# Patient Record
Sex: Female | Born: 1980 | Race: White | Hispanic: No | Smoking: Never smoker
Health system: Southern US, Community
[De-identification: ages and names within clinical notes are randomized; demographics above are authoritative.]

## PROBLEM LIST (undated history)

## (undated) DIAGNOSIS — F53 Postpartum depression: Secondary | ICD-10-CM

## (undated) DIAGNOSIS — O99345 Other mental disorders complicating the puerperium: Secondary | ICD-10-CM

## (undated) DIAGNOSIS — D696 Thrombocytopenia, unspecified: Secondary | ICD-10-CM

## (undated) DIAGNOSIS — M25361 Other instability, right knee: Secondary | ICD-10-CM

## (undated) DIAGNOSIS — Z87898 Personal history of other specified conditions: Secondary | ICD-10-CM

## (undated) HISTORY — DX: Postpartum depression: F53.0

## (undated) HISTORY — PX: TONSILLECTOMY: SUR1361

## (undated) HISTORY — DX: Other mental disorders complicating the puerperium: O99.345

## (undated) HISTORY — DX: Other instability, right knee: M25.361

## (undated) HISTORY — PX: WISDOM TOOTH EXTRACTION: SHX21

## (undated) HISTORY — DX: Personal history of other specified conditions: Z87.898

## (undated) HISTORY — DX: Thrombocytopenia, unspecified: D69.6

---

## 2012-08-09 ENCOUNTER — Other Ambulatory Visit: Payer: Self-pay

## 2012-08-18 ENCOUNTER — Other Ambulatory Visit (HOSPITAL_COMMUNITY): Payer: Self-pay | Admitting: Obstetrics and Gynecology

## 2012-08-18 DIAGNOSIS — Z3682 Encounter for antenatal screening for nuchal translucency: Secondary | ICD-10-CM

## 2012-08-27 LAB — OB RESULTS CONSOLE ABO/RH: RH Type: POSITIVE

## 2012-09-01 ENCOUNTER — Other Ambulatory Visit: Payer: Self-pay

## 2012-09-01 ENCOUNTER — Encounter (HOSPITAL_COMMUNITY): Payer: Self-pay

## 2012-09-01 ENCOUNTER — Ambulatory Visit (HOSPITAL_COMMUNITY): Payer: 59

## 2012-09-01 ENCOUNTER — Ambulatory Visit (HOSPITAL_COMMUNITY)
Admission: RE | Admit: 2012-09-01 | Discharge: 2012-09-01 | Disposition: A | Discharge status: Home / Self Care | Payer: 59 | Source: Ambulatory Visit | Attending: Obstetrics and Gynecology | Admitting: Obstetrics and Gynecology

## 2012-09-01 DIAGNOSIS — Z3689 Encounter for other specified antenatal screening: Secondary | ICD-10-CM | POA: Insufficient documentation

## 2012-09-01 DIAGNOSIS — O351XX0 Maternal care for (suspected) chromosomal abnormality in fetus, not applicable or unspecified: Secondary | ICD-10-CM | POA: Insufficient documentation

## 2012-09-01 DIAGNOSIS — Z3682 Encounter for antenatal screening for nuchal translucency: Secondary | ICD-10-CM

## 2012-09-01 DIAGNOSIS — O3510X0 Maternal care for (suspected) chromosomal abnormality in fetus, unspecified, not applicable or unspecified: Secondary | ICD-10-CM | POA: Insufficient documentation

## 2012-09-01 NOTE — Progress Notes (Signed)
[name]  was seen today for an ultrasound appointment.  See full report in [facility].  Impression: Single IUP at 13 0/7 weeks Normal NT (1.4 mm).  A nasal bone was visualized. First trimester aneuploidy screen performed as noted above.    Recommendations: Please do not draw triple/quad screen, though patient should be offered MSAFP for neural tube defect screening.  Recommend ultrasound for fetal anatomy approximately [redacted] weeks gestation.  Alpha Gula, MD

## 2013-03-05 ENCOUNTER — Inpatient Hospital Stay (HOSPITAL_COMMUNITY)
Admission: AD | Admit: 2013-03-05 | Discharge: 2013-03-07 | DRG: 775 | Disposition: A | Discharge status: Home / Self Care | Payer: 59 | Source: Ambulatory Visit | Attending: Obstetrics and Gynecology | Admitting: Obstetrics and Gynecology

## 2013-03-05 ENCOUNTER — Encounter (HOSPITAL_COMMUNITY): Payer: 59 | Admitting: Anesthesiology

## 2013-03-05 ENCOUNTER — Inpatient Hospital Stay (HOSPITAL_COMMUNITY): Payer: 59 | Admitting: Anesthesiology

## 2013-03-05 ENCOUNTER — Encounter (HOSPITAL_COMMUNITY): Payer: Self-pay | Admitting: *Deleted

## 2013-03-05 LAB — OB RESULTS CONSOLE HEPATITIS B SURFACE ANTIGEN: Hepatitis B Surface Ag: NEGATIVE

## 2013-03-05 LAB — CBC
HCT: 34.8 % — ABNORMAL LOW (ref 36.0–46.0)
HCT: 34.8 % — ABNORMAL LOW (ref 36.0–46.0)
HCT: 33.5 % — ABNORMAL LOW (ref 36.0–46.0)
MCH: 28 pg (ref 26.0–34.0)
MCH: 27.5 pg (ref 26.0–34.0)
MCH: 28.4 pg (ref 26.0–34.0)
MCHC: 33.9 g/dL (ref 30.0–36.0)
MCV: 82.5 fL (ref 78.0–100.0)
MCV: 83.3 fL (ref 78.0–100.0)
Platelets: 129 10*3/uL — ABNORMAL LOW (ref 150–400)
Platelets: 126 10*3/uL — ABNORMAL LOW (ref 150–400)
RBC: 4.18 MIL/uL (ref 3.87–5.11)
RDW: 13.3 % (ref 11.5–15.5)
RDW: 13.4 % (ref 11.5–15.5)
WBC: 13.4 10*3/uL — ABNORMAL HIGH (ref 4.0–10.5)

## 2013-03-05 LAB — OB RESULTS CONSOLE HIV ANTIBODY (ROUTINE TESTING): HIV: NONREACTIVE

## 2013-03-05 LAB — OB RESULTS CONSOLE GBS: GBS: NEGATIVE

## 2013-03-05 LAB — OB RESULTS CONSOLE RPR: RPR: NONREACTIVE

## 2013-03-05 LAB — OB RESULTS CONSOLE RUBELLA ANTIBODY, IGM: Rubella: IMMUNE

## 2013-03-05 MED ORDER — IBUPROFEN 600 MG PO TABS
600.0000 mg | ORAL_TABLET | Freq: Four times a day (QID) | ORAL | Status: DC | PRN
  Administered 2013-03-05: 600 mg via ORAL
  Filled 2013-03-05: qty 1

## 2013-03-05 MED ORDER — ONDANSETRON HCL 4 MG/2ML IJ SOLN: 4.0000 mg | Freq: Four times a day (QID) | INTRAMUSCULAR | Status: DC | PRN

## 2013-03-05 MED ORDER — EPHEDRINE 5 MG/ML INJ
10.0000 mg | INTRAVENOUS | Status: DC | PRN
  Filled 2013-03-05: qty 2

## 2013-03-05 MED ORDER — OXYCODONE-ACETAMINOPHEN 5-325 MG PO TABS
1.0000 | ORAL_TABLET | ORAL | Status: DC | PRN
Start: 2013-03-05 — End: 2013-03-06

## 2013-03-05 MED ORDER — LACTATED RINGERS IV SOLN
500.0000 mL | Freq: Once | INTRAVENOUS | Status: AC
  Administered 2013-03-05: 500 mL via INTRAVENOUS

## 2013-03-05 MED ORDER — CITRIC ACID-SODIUM CITRATE 334-500 MG/5ML PO SOLN: 30.0000 mL | ORAL | Status: DC | PRN

## 2013-03-05 MED ORDER — EPHEDRINE 5 MG/ML INJ
10.0000 mg | INTRAVENOUS | Status: DC | PRN
  Filled 2013-03-05: qty 4
  Filled 2013-03-05: qty 2

## 2013-03-05 MED ORDER — DIPHENHYDRAMINE HCL 50 MG/ML IJ SOLN: 12.5000 mg | INTRAMUSCULAR | Status: DC | PRN

## 2013-03-05 MED ORDER — LACTATED RINGERS IV SOLN
INTRAVENOUS | Status: DC
  Administered 2013-03-05 (×2): via INTRAVENOUS

## 2013-03-05 MED ORDER — PHENYLEPHRINE 40 MCG/ML (10ML) SYRINGE FOR IV PUSH (FOR BLOOD PRESSURE SUPPORT)
80.0000 ug | PREFILLED_SYRINGE | INTRAVENOUS | Status: DC | PRN
  Filled 2013-03-05: qty 2
  Filled 2013-03-05: qty 5

## 2013-03-05 MED ORDER — FLEET ENEMA 7-19 GM/118ML RE ENEM: 1.0000 | ENEMA | RECTAL | Status: DC | PRN

## 2013-03-05 MED ORDER — OXYTOCIN BOLUS FROM INFUSION: 500.0000 mL | INTRAVENOUS | Status: DC

## 2013-03-05 MED ORDER — LIDOCAINE HCL (PF) 1 % IJ SOLN
INTRAMUSCULAR | Status: DC | PRN
  Administered 2013-03-05 (×4): 4 mL

## 2013-03-05 MED ORDER — TERBUTALINE SULFATE 1 MG/ML IJ SOLN: 0.2500 mg | Freq: Once | INTRAMUSCULAR | Status: AC | PRN

## 2013-03-05 MED ORDER — PHENYLEPHRINE 40 MCG/ML (10ML) SYRINGE FOR IV PUSH (FOR BLOOD PRESSURE SUPPORT)
80.0000 ug | PREFILLED_SYRINGE | INTRAVENOUS | Status: DC | PRN
  Filled 2013-03-05: qty 2

## 2013-03-05 MED ORDER — OXYTOCIN 40 UNITS IN LACTATED RINGERS INFUSION - SIMPLE MED
1.0000 m[IU]/min | INTRAVENOUS | Status: DC
  Administered 2013-03-05: 1 m[IU]/min via INTRAVENOUS
  Filled 2013-03-05: qty 1000

## 2013-03-05 MED ORDER — ACETAMINOPHEN 325 MG PO TABS: 650.0000 mg | ORAL_TABLET | ORAL | Status: DC | PRN

## 2013-03-05 MED ORDER — OXYTOCIN 40 UNITS IN LACTATED RINGERS INFUSION - SIMPLE MED: 62.5000 mL/h | INTRAVENOUS | Status: DC

## 2013-03-05 MED ORDER — LIDOCAINE HCL (PF) 1 % IJ SOLN
30.0000 mL | INTRAMUSCULAR | Status: DC | PRN
  Filled 2013-03-05 (×2): qty 30

## 2013-03-05 MED ORDER — FENTANYL 2.5 MCG/ML BUPIVACAINE 1/10 % EPIDURAL INFUSION (WH - ANES)
14.0000 mL/h | INTRAMUSCULAR | Status: DC | PRN
  Administered 2013-03-05 (×2): 14 mL/h via EPIDURAL
  Filled 2013-03-05 (×2): qty 125

## 2013-03-05 MED ORDER — LACTATED RINGERS IV SOLN: 500.0000 mL | INTRAVENOUS | Status: DC | PRN

## 2013-03-05 NOTE — MAU Note (Signed)
Rupture of membranes at 0300, occasional uc.

## 2013-03-05 NOTE — Progress Notes (Signed)
Patient ID: [name], female   DOB: 1980/09/04, 32 y.o.   MRN: [id number] Delivery note:  The pt pushed very well and shortly before delivery the vertex rotated LOP to LOA and she delivered spontaneously over a second degree ML laceration a living female infant Apgars 9 and 9 at 1 and 5 minutes.The placenta delivered intact and the uterus was normal. Rectal was negative. There was 1 loop of nuchal cord. The laceration was repaired with 3-0 vicryl EBL 400 cc's.

## 2013-03-05 NOTE — H&P (Signed)
[name], [name] NO.:  [id number]  MEDICAL RECORD NO.:  [id number]  LOCATION:  9174                          FACILITY:  [facility]  PHYSICIAN:  Malachi Pro. Ambrose Mantle, M.D. DATE OF BIRTH:  [birth year]  DATE OF ADMISSION:  03/05/2013 DATE OF DISCHARGE:                             HISTORY & PHYSICAL   PRESENT ILLNESS:  This is a 32 year old white female para 0-0-1-0 gravida 2 with EDC on March 09, 2013, estimated gestational age [redacted] weeks and 3 days, who was admitted with premature rupture of the membranes at 3 a.m. on the day of admission.  Blood group and type is O positive with a negative antibody.  Rubella was positive.  Hepatitis B surface antigen negative, RPR nonreactive.  HIV negative.  GC and Chlamydia negative.  One-hour Glucola 128.  Group B strep is negative.  PAST MEDICAL HISTORY:  No significant history.  She had adenoidectomy and tonsillectomy in the past.  SOCIAL HISTORY:  She never smoked.  Formally used alcohol, none at the present time.  She is a Designer, jewellery.  ALLERGIES:  She has no known latex allergy.  She says she was allergic to AMOXICILLIN as a child, it caused her to have a rash.  FAMILY HISTORY:  Her maternal grandmother had colon cancer.  Maternal grandfather with heart attack.  PHYSICAL EXAMINATION:  VITAL SIGNS:  Temperature 98.5, pulse 90, respirations 18, blood pressure 114/76. CARDIOVASCULAR:  Heart normal size and sounds.  No murmurs. LUNGS:  Clear to auscultation. ABDOMEN:  Soft.  Fetal heart tones normal.  Cervix per the RN is 2 cm, 50%, vertex at -2.  Rupture of membranes was confirmed.  ADMITTING IMPRESSION:  Intrauterine pregnancy at 39 plus weeks, premature rupture of the membranes.  The patient has been started on Pitocin.     Malachi Pro. Ambrose Mantle, M.D.     TFH/MEDQ  D:  03/05/2013  T:  03/05/2013  Job:  [id number]

## 2013-03-05 NOTE — Progress Notes (Signed)
Patient ID: [name], female   DOB: 1980/10/07, 32 y.o.   MRN: [id number] The pt became fully dilated at 7:39 PM and was instructed to push. There was considerable bloody show and the vertex was at 0 station LOP There are some decelerations with contractions and the pt has been asked to push in a lateral tilt position.

## 2013-03-05 NOTE — Anesthesia Preprocedure Evaluation (Signed)

## 2013-03-05 NOTE — Anesthesia Procedure Notes (Signed)
Epidural Patient location during procedure: OB Start time: 03/05/2013 1:21 PM  Staffing Performed by: anesthesiologist   Preanesthetic Checklist Completed: patient identified, site marked, surgical consent, pre-op evaluation, timeout performed, IV checked, risks and benefits discussed and monitors and equipment checked  Epidural Patient position: sitting Prep: site prepped and draped and DuraPrep Patient monitoring: continuous pulse ox and blood pressure Approach: midline Injection technique: LOR air  Needle:  Needle type: Tuohy  Needle gauge: 17 G Needle length: 9 cm and 9 Needle insertion depth: 5 cm cm Catheter type: closed end flexible Catheter size: 19 Gauge Catheter at skin depth: 10 cm Test dose: negative  Assessment Events: blood not aspirated, injection not painful, no injection resistance, negative IV test and no paresthesia  Additional Notes Discussed risk of headache, infection, bleeding, nerve injury and failed or incomplete block.  Patient voices understanding and wishes to proceed.  Epidural placed easily on first attempt.  No paresthesia.  Patient tolerated procedure well with no apparent complications.  Jasmine December, MD Reason for block:procedure for pain

## 2013-03-05 NOTE — Progress Notes (Signed)
Patient ID: [name], female   DOB: [birth year], 32 y.o.   MRN: [id number] Pitocin at 7 mu/minute The contractions are not painful. The cervix is 3 cm 50% effaced and the vertex is at - 2 station. Will try to increase the pitocin.

## 2013-03-06 ENCOUNTER — Encounter (HOSPITAL_COMMUNITY): Payer: Self-pay | Admitting: *Deleted

## 2013-03-06 LAB — CBC
MCV: 82.6 fL (ref 78.0–100.0)
Platelets: 113 10*3/uL — ABNORMAL LOW (ref 150–400)
RBC: 3.79 MIL/uL — ABNORMAL LOW (ref 3.87–5.11)
WBC: 12.2 10*3/uL — ABNORMAL HIGH (ref 4.0–10.5)

## 2013-03-06 LAB — ABO/RH: ABO/RH(D): O POS

## 2013-03-06 MED ORDER — WITCH HAZEL-GLYCERIN EX PADS: 1.0000 "application " | MEDICATED_PAD | CUTANEOUS | Status: DC | PRN

## 2013-03-06 MED ORDER — SIMETHICONE 80 MG PO CHEW: 80.0000 mg | CHEWABLE_TABLET | ORAL | Status: DC | PRN

## 2013-03-06 MED ORDER — LANOLIN HYDROUS EX OINT: TOPICAL_OINTMENT | CUTANEOUS | Status: DC | PRN

## 2013-03-06 MED ORDER — IBUPROFEN 600 MG PO TABS
600.0000 mg | ORAL_TABLET | Freq: Four times a day (QID) | ORAL | Status: DC
  Administered 2013-03-06 – 2013-03-07 (×6): 600 mg via ORAL
  Filled 2013-03-06 (×6): qty 1

## 2013-03-06 MED ORDER — ONDANSETRON HCL 4 MG/2ML IJ SOLN: 4.0000 mg | INTRAMUSCULAR | Status: DC | PRN

## 2013-03-06 MED ORDER — DIBUCAINE 1 % RE OINT: 1.0000 "application " | TOPICAL_OINTMENT | RECTAL | Status: DC | PRN

## 2013-03-06 MED ORDER — OXYCODONE-ACETAMINOPHEN 5-325 MG PO TABS
1.0000 | ORAL_TABLET | ORAL | Status: DC | PRN
  Administered 2013-03-06: 1 via ORAL
  Filled 2013-03-06: qty 1

## 2013-03-06 MED ORDER — EPINEPHRINE TOPICAL FOR CIRCUMCISION 0.1 MG/ML: 1.0000 [drp] | TOPICAL | Status: DC | PRN

## 2013-03-06 MED ORDER — ACETAMINOPHEN FOR CIRCUMCISION 160 MG/5 ML
40.0000 mg | ORAL | Status: DC | PRN
  Filled 2013-03-06: qty 2.5

## 2013-03-06 MED ORDER — ONDANSETRON HCL 4 MG PO TABS: 4.0000 mg | ORAL_TABLET | ORAL | Status: DC | PRN

## 2013-03-06 MED ORDER — SUCROSE 24% NICU/PEDS ORAL SOLUTION
0.5000 mL | OROMUCOSAL | Status: DC | PRN
  Filled 2013-03-06: qty 0.5

## 2013-03-06 MED ORDER — DIPHENHYDRAMINE HCL 25 MG PO CAPS: 25.0000 mg | ORAL_CAPSULE | Freq: Four times a day (QID) | ORAL | Status: DC | PRN

## 2013-03-06 MED ORDER — ACETAMINOPHEN FOR CIRCUMCISION 160 MG/5 ML
40.0000 mg | Freq: Once | ORAL | Status: DC
  Filled 2013-03-06: qty 2.5

## 2013-03-06 MED ORDER — SENNOSIDES-DOCUSATE SODIUM 8.6-50 MG PO TABS
2.0000 | ORAL_TABLET | ORAL | Status: DC
  Administered 2013-03-06 – 2013-03-07 (×2): 2 via ORAL
  Filled 2013-03-06 (×2): qty 2

## 2013-03-06 MED ORDER — PRENATAL MULTIVITAMIN CH
1.0000 | ORAL_TABLET | Freq: Every day | ORAL | Status: DC
  Administered 2013-03-06 – 2013-03-07 (×2): 1 via ORAL
  Filled 2013-03-06 (×2): qty 1

## 2013-03-06 MED ORDER — LACTATED RINGERS IV SOLN: INTRAVENOUS | Status: AC

## 2013-03-06 MED ORDER — OXYTOCIN 40 UNITS IN LACTATED RINGERS INFUSION - SIMPLE MED: 62.5000 mL/h | INTRAVENOUS | Status: AC | PRN

## 2013-03-06 MED ORDER — TETANUS-DIPHTH-ACELL PERTUSSIS 5-2.5-18.5 LF-MCG/0.5 IM SUSP: 0.5000 mL | Freq: Once | INTRAMUSCULAR | Status: DC

## 2013-03-06 MED ORDER — ZOLPIDEM TARTRATE 5 MG PO TABS: 5.0000 mg | ORAL_TABLET | Freq: Every evening | ORAL | Status: DC | PRN

## 2013-03-06 MED ORDER — BENZOCAINE-MENTHOL 20-0.5 % EX AERO
1.0000 "application " | INHALATION_SPRAY | CUTANEOUS | Status: DC | PRN
  Administered 2013-03-06: 1 via TOPICAL
  Filled 2013-03-06: qty 56

## 2013-03-06 MED ORDER — MEASLES, MUMPS & RUBELLA VAC ~~LOC~~ INJ
0.5000 mL | INJECTION | Freq: Once | SUBCUTANEOUS | Status: DC
  Filled 2013-03-06: qty 0.5

## 2013-03-06 MED ORDER — LIDOCAINE 1%/NA BICARB 0.1 MEQ INJECTION
0.8000 mL | INJECTION | Freq: Once | INTRAVENOUS | Status: DC
  Filled 2013-03-06: qty 1

## 2013-03-06 NOTE — Progress Notes (Signed)
Patient ID: [name], female   DOB: Sep 10, 1980, 32 y.o.   MRN: [id number] #1 afebrile BP normal No problems HGB stable

## 2013-03-06 NOTE — Anesthesia Postprocedure Evaluation (Signed)
Anesthesia Post Note  Patient: [name]  Procedure(s) Performed: * No procedures listed *  Anesthesia type: Epidural  Patient location: Mother/Baby  Post pain: Pain level controlled  Post assessment: Post-op Vital signs reviewed  Last Vitals:  Filed Vitals:   03/06/13 0517  BP: 110/64  Pulse: 80  Temp: 36.7 C  Resp: 18    Post vital signs: Reviewed  Level of consciousness:alert  Complications: No apparent anesthesia complications

## 2013-03-07 MED ORDER — IBUPROFEN 600 MG PO TABS: 600.0000 mg | ORAL_TABLET | Freq: Four times a day (QID) | ORAL | Status: DC | PRN

## 2013-03-07 MED ORDER — OXYCODONE-ACETAMINOPHEN 5-325 MG PO TABS: 1.0000 | ORAL_TABLET | Freq: Four times a day (QID) | ORAL | Status: DC | PRN

## 2013-03-07 NOTE — Discharge Summary (Signed)
[name], [name] NO.:  [id number]  MEDICAL RECORD NO.:  [id number]  LOCATION:  9117                          [facility]ACILITY:  [facility]  PHYSICIAN:  Malachi Pro. Ambrose Mantle, M.D. DATE OF BIRTH:  11/11/1980  DATE OF ADMISSION:  03/05/2013 DATE OF DISCHARGE:  03/07/2013                              DISCHARGE SUMMARY   A 32 year old white female, para 0-0-1-0, gravida 2, [facility] March 09, 2013, with estimated gestational age of [redacted] weeks and 3 days who was admitted with premature rupture of membranes at 3 a.m.  All labs were normal.  On admission, the patient was thought to be 2 cm and 50% by the admitting nurse.  Pitocin was begun and the patient was 3 cm, 50% at 10:12 a.m.  The patient received an epidural and she progressed to full dilatation at 7:39 p.m. and was instructed to push.  There was considerable bloody show and the vertex was at 0 station, LOP.  She was noted to have some deceleration to the heart rate, so she was placed in lateral position.  She pushed very well, and shortly before delivery, the vertex rotated LOP to LOA.  She delivered spontaneously over a second-degree midline laceration by Dr. Ambrose Mantle, a living female infant, 8 pounds, 10 ounces with Apgars of 9 and 9 at 1 and 5 minutes.  Placenta delivered intact.  Uterus normal.  Rectal negative.  One loop of nuchal cord was present.  Laceration was repaired with 3-0 Vicryl.  Blood loss about 400 mL.  Postpartum, the patient did quite well and was discharged on the second postpartum day.  The baby was noted to have an undescended testicle and is going to have an ultrasound done for that.  LABORATORY DATA:  Showed initial hemoglobin of 11.8, hematocrit 34.8, white count 9700, platelet count 123,000.  Several followup blood counts were done and hemoglobin remained relatively stable, the lowest was 10.5, platelet counts ranged from 123-129-126 and 113.  RPR was nonreactive.  FINAL DIAGNOSES:  Intrauterine pregnancy  39 plus weeks, delivered left occiput anterior operation, spontaneous delivery left occiput anterior repair of second-degree midline laceration.  FINAL CONDITION:  Improved.  INSTRUCTIONS:  Include our regular discharge instruction booklet.  The patient is also to receive an after visit summary.  Prescriptions for Motrin 600 mg 30 tablets, 1 every 6 hours as needed for pain and Percocet 5/325, 15 tablets 1 every 6 hours as needed for pain.  The patient is advised to return to the office in 6 weeks for followup examination.     Malachi Pro. Ambrose Mantle, M.D.     TFH/MEDQ  D:  03/07/2013  T:  03/07/2013  Job:  [id number]

## 2013-03-07 NOTE — Progress Notes (Signed)
Patient ID: [name], female   DOB: 1981-02-11, 32 y.o.   MRN: [id number] #[id number] afebrile BP normal for d/c Baby to have [place] for undescended testicle

## 2013-12-05 LAB — OB RESULTS CONSOLE GC/CHLAMYDIA
Chlamydia: NEGATIVE
Gonorrhea: NEGATIVE

## 2013-12-05 LAB — OB RESULTS CONSOLE ABO/RH: RH Type: POSITIVE

## 2013-12-05 LAB — OB RESULTS CONSOLE RPR: RPR: NONREACTIVE

## 2013-12-05 LAB — OB RESULTS CONSOLE HIV ANTIBODY (ROUTINE TESTING): HIV: NONREACTIVE

## 2013-12-05 LAB — OB RESULTS CONSOLE HEPATITIS B SURFACE ANTIGEN: HEP B S AG: NEGATIVE

## 2013-12-05 LAB — OB RESULTS CONSOLE ANTIBODY SCREEN: Antibody Screen: NEGATIVE

## 2013-12-05 LAB — OB RESULTS CONSOLE RUBELLA ANTIBODY, IGM: RUBELLA: IMMUNE

## 2014-03-27 ENCOUNTER — Encounter (HOSPITAL_COMMUNITY): Payer: Self-pay | Admitting: *Deleted

## 2014-05-26 NOTE — L&D Delivery Note (Signed)
Delivery Note At 7:45 AM, after 1 contraction and 3 pushes,  a viable and healthy female was delivered via Vaginal, Spontaneous Delivery (Presentation: Left Occiput Anterior).  APGAR: 9, 9; weight  P.   Placenta status: Intact, Spontaneous.  Cord: 3 vessels with the following complications: Nuchal.    Anesthesia: Epidural  Episiotomy: None Lacerations: 2nd degree Suture Repair: 3.0 vicryl Est. Blood Loss (mL): 400cc  Mom to postpartum.  Baby to Couplet care / Skin to Skin.  Bovard-Stuckert, Elianna Windom 06/27/2014, 8:19 AM  Br/O+/IUD - Mirena PP/Tdap in PNC/RI  D/w pt circumcision for female infant, including r/b/a.  Will proceed.

## 2014-05-30 LAB — OB RESULTS CONSOLE GBS: GBS: NEGATIVE

## 2014-06-14 ENCOUNTER — Emergency Department (HOSPITAL_COMMUNITY): Admission: EM | Admit: 2014-06-14 | Discharge: 2014-06-14 | Discharge status: Absent without leave | Payer: Self-pay

## 2014-06-14 ENCOUNTER — Encounter (HOSPITAL_COMMUNITY): Payer: Self-pay | Admitting: Emergency Medicine

## 2014-06-14 ENCOUNTER — Emergency Department (HOSPITAL_COMMUNITY)
Admission: EM | Admit: 2014-06-14 | Discharge: 2014-06-14 | Disposition: A | Discharge status: Home / Self Care | Payer: 59 | Attending: Emergency Medicine | Admitting: Emergency Medicine

## 2014-06-14 DIAGNOSIS — R51 Headache: Secondary | ICD-10-CM | POA: Insufficient documentation

## 2014-06-14 DIAGNOSIS — O9989 Other specified diseases and conditions complicating pregnancy, childbirth and the puerperium: Secondary | ICD-10-CM | POA: Insufficient documentation

## 2014-06-14 DIAGNOSIS — R519 Headache, unspecified: Secondary | ICD-10-CM

## 2014-06-14 DIAGNOSIS — R140 Abdominal distension (gaseous): Secondary | ICD-10-CM | POA: Diagnosis not present

## 2014-06-14 DIAGNOSIS — R509 Fever, unspecified: Secondary | ICD-10-CM

## 2014-06-14 DIAGNOSIS — Z880 Allergy status to penicillin: Secondary | ICD-10-CM | POA: Insufficient documentation

## 2014-06-14 DIAGNOSIS — Z3490 Encounter for supervision of normal pregnancy, unspecified, unspecified trimester: Secondary | ICD-10-CM

## 2014-06-14 DIAGNOSIS — Z79899 Other long term (current) drug therapy: Secondary | ICD-10-CM | POA: Diagnosis not present

## 2014-06-14 DIAGNOSIS — Z3A39 39 weeks gestation of pregnancy: Secondary | ICD-10-CM | POA: Insufficient documentation

## 2014-06-14 DIAGNOSIS — R600 Localized edema: Secondary | ICD-10-CM | POA: Insufficient documentation

## 2014-06-14 LAB — CBC WITH DIFFERENTIAL/PLATELET
BASOS ABS: 0 10*3/uL (ref 0.0–0.1)
Basophils Relative: 0 % (ref 0–1)
Eosinophils Absolute: 0 10*3/uL (ref 0.0–0.7)
Eosinophils Relative: 0 % (ref 0–5)
HEMATOCRIT: 35.5 % — AB (ref 36.0–46.0)
Hemoglobin: 11.8 g/dL — ABNORMAL LOW (ref 12.0–15.0)
LYMPHS PCT: 14 % (ref 12–46)
Lymphs Abs: 1.1 10*3/uL (ref 0.7–4.0)
MCH: 28.9 pg (ref 26.0–34.0)
MCHC: 33.2 g/dL (ref 30.0–36.0)
MCV: 86.8 fL (ref 78.0–100.0)
MONO ABS: 0.4 10*3/uL (ref 0.1–1.0)
MONOS PCT: 5 % (ref 3–12)
NEUTROS ABS: 6.8 10*3/uL (ref 1.7–7.7)
Neutrophils Relative %: 81 % — ABNORMAL HIGH (ref 43–77)
Platelets: 122 10*3/uL — ABNORMAL LOW (ref 150–400)
RBC: 4.09 MIL/uL (ref 3.87–5.11)
RDW: 13.7 % (ref 11.5–15.5)
WBC: 8.4 10*3/uL (ref 4.0–10.5)

## 2014-06-14 LAB — URINALYSIS, ROUTINE W REFLEX MICROSCOPIC
Bilirubin Urine: NEGATIVE
Glucose, UA: NEGATIVE mg/dL
HGB URINE DIPSTICK: NEGATIVE
KETONES UR: 15 mg/dL — AB
Nitrite: NEGATIVE
Protein, ur: NEGATIVE mg/dL
SPECIFIC GRAVITY, URINE: 1.018 (ref 1.005–1.030)
Urobilinogen, UA: 1 mg/dL (ref 0.0–1.0)
pH: 8 (ref 5.0–8.0)

## 2014-06-14 LAB — URINE MICROSCOPIC-ADD ON

## 2014-06-14 LAB — COMPREHENSIVE METABOLIC PANEL
ALBUMIN: 3.4 g/dL — AB (ref 3.5–5.2)
ALT: 14 U/L (ref 0–35)
AST: 22 U/L (ref 0–37)
Alkaline Phosphatase: 142 U/L — ABNORMAL HIGH (ref 39–117)
Anion gap: 9 (ref 5–15)
BUN: 5 mg/dL — ABNORMAL LOW (ref 6–23)
CALCIUM: 8.5 mg/dL (ref 8.4–10.5)
CO2: 20 mmol/L (ref 19–32)
CREATININE: 0.6 mg/dL (ref 0.50–1.10)
Chloride: 103 mEq/L (ref 96–112)
GFR calc Af Amer: >90 mL/min (ref >90)
GFR calc non Af Amer: >90 mL/min (ref >90)
Glucose, Bld: 93 mg/dL (ref 70–99)
Potassium: 3.3 mmol/L — ABNORMAL LOW (ref 3.5–5.1)
Sodium: 132 mmol/L — ABNORMAL LOW (ref 135–145)
TOTAL PROTEIN: 6.9 g/dL (ref 6.0–8.3)
Total Bilirubin: 0.8 mg/dL (ref 0.3–1.2)

## 2014-06-14 MED ORDER — ACETAMINOPHEN 500 MG PO TABS
1000.0000 mg | ORAL_TABLET | Freq: Once | ORAL | Status: AC
  Administered 2014-06-14: 1000 mg via ORAL
  Filled 2014-06-14: qty 2

## 2014-06-14 NOTE — ED Notes (Signed)
MD at bedside. 

## 2014-06-14 NOTE — ED Notes (Signed)
Pt c/o constant headache since yesterday afternoon.  Pain score 6/10.  Denies light sensitivity and blurred vision.

## 2014-06-14 NOTE — ED Notes (Signed)
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  Sent over here by OBGYN. Pt is 38 wks 4 days pregnant and began having intermittent fever (being treated with tylenol).  Also c/o headache.  Able to touch chin to chest.  States that it is painful.  States that baby's HR was 180 at the doctor's office.  Denies NVD.

## 2014-06-14 NOTE — Progress Notes (Signed)
Pt is 34 year old G3P1 38w 5d gestation with headache over the last 2 days and low grade fever. No N/V/D, nobody else in the house sick. Was seen in OB office and sent to ER for further evaluation.  FH via ext monitor 170bpm, good variability, no decels. (OB aware of FH, FH was elevated in office). Occ contractions noted on monitor, pt states they are Braxton-Hicks and she has been having them over the past week or longer. Baby very active. No vag bleeding or discharge. No spotty vision or epigastric pain. Pt sipping clear liquids.

## 2014-06-14 NOTE — ED Provider Notes (Signed)
CSN: [id number]     Arrival date & time 06/14/14  1225 History   First MD Initiated Contact with Patient 06/14/14 1241     Chief Complaint  Patient presents with  . Fever  . Headache     (Consider location/radiation/quality/duration/timing/severity/associated sxs/prior Treatment) HPI Comments: Patient is a 34 year old female G3 P2002 at 38-1/[redacted] weeks gestation. She was sent prior her primary Dr. for evaluation of headache and fever and concerns over possible meningitis. She is at low grade fevers for the past 2 days, then developed a headache earlier this morning. She denies to me she is having any stiff neck or neck pain. She denies any cough, vomiting, or diarrhea.  Patient is a 34 y.o. female presenting with fever. The history is provided by the patient.  Fever Max temp prior to arrival:  102 Temp source:  Oral Severity:  Moderate Onset quality:  Gradual Duration:  2 days Timing:  Intermittent Progression:  Worsening Chronicity:  New Relieved by:  Nothing Worsened by:  Nothing tried Ineffective treatments:  None tried   Past Medical History  Diagnosis Date  . Medical history non-contributory    Past Surgical History  Procedure Laterality Date  . Tonsillectomy     History reviewed. No pertinent family history. History  Substance Use Topics  . Smoking status: Never Smoker   . Smokeless tobacco: Not on file  . Alcohol Use: No   OB History    Gravida Para Term Preterm AB TAB SAB Ectopic Multiple Living   3 1 1  0 1 0 1 0 0 1     Review of Systems  Constitutional: Positive for fever.  All other systems reviewed and are negative.     Allergies  Amoxicillin  Home Medications   Prior to Admission medications   Medication Sig Start Date End Date Taking? Authorizing Provider  acetaminophen (TYLENOL) 500 MG tablet Take 500 mg by mouth every 6 (six) hours as needed (pain.).   Yes Historical Provider, MD  oxyCODONE-acetaminophen (PERCOCET/ROXICET) 5-325 MG per  tablet Take 1 tablet by mouth every 6 (six) hours as needed. 03/07/13  Yes Melina Schools, MD  Prenatal Vit-Fe Fumarate-FA (PRENATAL MULTIVITAMIN) TABS tablet Take 1 tablet by mouth daily at 12 noon.   Yes Historical Provider, MD  ibuprofen (ADVIL,MOTRIN) 600 MG tablet Take 1 tablet (600 mg total) by mouth every 6 (six) hours as needed for pain. Patient not taking: Reported on 06/14/2014 03/07/13   Melina Schools, MD   BP 151/84 mmHg  Pulse 116  Temp(Src) 99.4 F (37.4 C) (Oral)  Resp 18  SpO2 98% Physical Exam  Constitutional: She is oriented to person, place, and time. She appears well-developed and well-nourished. No distress.  HENT:  Head: Normocephalic and atraumatic.  Eyes: EOM are normal. Pupils are equal, round, and reactive to light.  There is no papilledema on funduscopic exam.  Neck: Normal range of motion. Neck supple.  She has full range of motion of her neck without limitation.  Cardiovascular: Normal rate and regular rhythm.  Exam reveals no gallop and no friction rub.   No murmur heard. Pulmonary/Chest: Effort normal and breath sounds normal. No respiratory distress. She has no wheezes.  Abdominal: Soft. Bowel sounds are normal. She exhibits distension. There is no tenderness.  Fundal height is consistent with stated gestational age.  Musculoskeletal: Normal range of motion. She exhibits edema.  There is trace bilateral lower extremity edema.  Lymphadenopathy:    She has no cervical adenopathy.  Neurological:  She is alert and oriented to person, place, and time.  Skin: Skin is warm and dry. She is not diaphoretic.  Nursing note and vitals reviewed.   ED Course  Procedures (including critical care time) Labs Review Labs Reviewed  URINALYSIS, ROUTINE W REFLEX MICROSCOPIC  COMPREHENSIVE METABOLIC PANEL  CBC WITH DIFFERENTIAL    Imaging Review No results found.   EKG Interpretation None      MDM   Final diagnoses:  None    Patient presents with  fever and headache at 38-1/[redacted] weeks gestation. She was sent by her PCP over concerns for possible meningitis. The patient does not appear meningitic and she has no limitation with range of motion in her neck. She is nontoxic appearing. Workup reveals no elevation of white count and laboratory studies are not consistent with preeclampsia.  She was observed on the monitor by the rapid response OB nurse and was reassuring. 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 We had a lengthy conversation regarding the next appropriate step in her workup. She does not feel as though she has meningitis and is not thrilled about having an LP performed. We have decided to give this time. If she does not improve or worsens over the next 24-48 hours, she understands to return to discuss an LP.    Veryl Speak, MD 06/14/14 [phone]

## 2014-06-14 NOTE — ED Notes (Signed)
Rapid Response L & D at bedside.

## 2014-06-14 NOTE — Discharge Instructions (Signed)
Tylenol 1000 mg every 6 hours as needed for pain or fever.  Return to the ER if you develop worsening headache, stiff neck, or other new and concerning symptoms.   General Headache Without Cause A headache is pain or discomfort felt around the head or neck area. The specific cause of a headache may not be found. There are many causes and types of headaches. A few common ones are:  Tension headaches.  Migraine headaches.  Cluster headaches.  Chronic daily headaches. HOME CARE INSTRUCTIONS   Keep all follow-up appointments with your caregiver or any specialist referral.  Only take over-the-counter or prescription medicines for pain or discomfort as directed by your caregiver.  Lie down in a dark, quiet room when you have a headache.  Keep a headache journal to find out what may trigger your migraine headaches. For example, write down:  What you eat and drink.  How much sleep you get.  Any change to your diet or medicines.  Try massage or other relaxation techniques.  Put ice packs or heat on the head and neck. Use these 3 to 4 times per day for 15 to 20 minutes each time, or as needed.  Limit stress.  Sit up straight, and do not tense your muscles.  Quit smoking if you smoke.  Limit alcohol use.  Decrease the amount of caffeine you drink, or stop drinking caffeine.  Eat and sleep on a regular schedule.  Get 7 to 9 hours of sleep, or as recommended by your caregiver.  Keep lights dim if bright lights bother you and make your headaches worse. SEEK MEDICAL CARE IF:   You have problems with the medicines you were prescribed.  Your medicines are not working.  You have a change from the usual headache.  You have nausea or vomiting. SEEK IMMEDIATE MEDICAL CARE IF:   Your headache becomes severe.  You have a fever.  You have a stiff neck.  You have loss of vision.  You have muscular weakness or loss of muscle control.  You start losing your balance or have  trouble walking.  You feel faint or pass out.  You have severe symptoms that are different from your first symptoms. MAKE SURE YOU:   Understand these instructions.  Will watch your condition.  Will get help right away if you are not doing well or get worse. Document Released: 05/12/2005 Document Revised: 08/04/2011 Document Reviewed: 05/28/2011 [facility] Patient Information 2015 [place], [place]. This information is not intended to replace advice given to you by your health care provider. Make sure you discuss any questions you have with your health care provider.  Fever, Adult A fever is a higher than normal body temperature. In an adult, an oral temperature around 98.6 F (37 C) is considered normal. A temperature of 100.4 F (38 C) or higher is generally considered a fever. Mild or moderate fevers generally have no long-term effects and often do not require treatment. Extreme fever (greater than or equal to 106 F or 41.1 C) can cause seizures. The sweating that may occur with repeated or prolonged fever may cause dehydration. Elderly people can develop confusion during a fever. A measured temperature can vary with:  Age.  Time of day.  Method of measurement (mouth, underarm, rectal, or ear). The fever is confirmed by taking a temperature with a thermometer. Temperatures can be taken different ways. Some methods are accurate and some are not.  An oral temperature is used most commonly. Electronic thermometers are fast  and accurate.  An ear temperature will only be accurate if the thermometer is positioned as recommended by the manufacturer.  A rectal temperature is accurate and done for those adults who have a condition where an oral temperature cannot be taken.  An underarm (axillary) temperature is not accurate and not recommended. Fever is a symptom, not a disease.  CAUSES   Infections commonly cause fever.  Some noninfectious causes for fever include:  Some arthritis  conditions.  Some thyroid or adrenal gland conditions.  Some immune system conditions.  Some types of cancer.  A medicine reaction.  High doses of certain street drugs such as methamphetamine.  Dehydration.  Exposure to high outside or room temperatures.  Occasionally, the source of a fever cannot be determined. This is sometimes called a "fever of unknown origin" (FUO).  Some situations may lead to a temporary rise in body temperature that may go away on its own. Examples are:  Childbirth.  Surgery.  Intense exercise. HOME CARE INSTRUCTIONS   Take appropriate medicines for fever. Follow dosing instructions carefully. If you use acetaminophen to reduce the fever, be careful to avoid taking other medicines that also contain acetaminophen. Do not take aspirin for a fever if you are younger than age 6. There is an association with Reye's syndrome. Reye's syndrome is a rare but potentially deadly disease.  If an infection is present and antibiotics have been prescribed, take them as directed. Finish them even if you start to feel better.  Rest as needed.  Maintain an adequate fluid intake. To prevent dehydration during an illness with prolonged or recurrent fever, you may need to drink extra fluid.Drink enough fluids to keep your urine clear or pale yellow.  Sponging or bathing with room temperature water may help reduce body temperature. Do not use ice water or alcohol sponge baths.  Dress comfortably, but do not over-bundle. SEEK MEDICAL CARE IF:   You are unable to keep fluids down.  You develop vomiting or diarrhea.  You are not feeling at least partly better after 3 days.  You develop new symptoms or problems. SEEK IMMEDIATE MEDICAL CARE IF:   You have shortness of breath or trouble breathing.  You develop excessive weakness.  You are dizzy or you faint.  You are extremely thirsty or you are making little or no urine.  You develop new pain that was not  there before (such as in the head, neck, chest, back, or abdomen).  You have persistent vomiting and diarrhea for more than 1 to 2 days.  You develop a stiff neck or your eyes become sensitive to light.  You develop a skin rash.  You have a fever or persistent symptoms for more than 2 to 3 days.  You have a fever and your symptoms suddenly get worse. MAKE SURE YOU:   Understand these instructions.  Will watch your condition.  Will get help right away if you are not doing well or get worse. Document Released: 11/05/2000 Document Revised: 09/26/2013 Document Reviewed: 03/13/2011 [facility] Patient Information 2015 [place], [place]. This information is not intended to replace advice given to you by your health care provider. Make sure you discuss any questions you have with your health care provider.

## 2014-06-14 NOTE — Progress Notes (Signed)
Dr Ulanda Edison notified of pt's status and FH now 165bpm. Pt continues to contract about q 47mins but states they are not painful and describes them only as "tightness". Dr Ulanda Edison has cleared pt obstetrically. Reviewed signs of labor and ROM. Pt to keep next regularly scheduled OB appointment.

## 2014-06-27 ENCOUNTER — Inpatient Hospital Stay (HOSPITAL_COMMUNITY): Payer: 59 | Admitting: Anesthesiology

## 2014-06-27 ENCOUNTER — Inpatient Hospital Stay (HOSPITAL_COMMUNITY)
Admission: AD | Admit: 2014-06-27 | Discharge: 2014-06-28 | DRG: 775 | Disposition: A | Discharge status: Home / Self Care | Payer: 59 | Source: Ambulatory Visit | Attending: Obstetrics and Gynecology | Admitting: Obstetrics and Gynecology

## 2014-06-27 ENCOUNTER — Encounter (HOSPITAL_COMMUNITY): Payer: Self-pay | Admitting: *Deleted

## 2014-06-27 DIAGNOSIS — O480 Post-term pregnancy: Secondary | ICD-10-CM | POA: Diagnosis present

## 2014-06-27 DIAGNOSIS — Z3A40 40 weeks gestation of pregnancy: Secondary | ICD-10-CM | POA: Diagnosis present

## 2014-06-27 DIAGNOSIS — Z3483 Encounter for supervision of other normal pregnancy, third trimester: Secondary | ICD-10-CM | POA: Diagnosis present

## 2014-06-27 DIAGNOSIS — IMO0001 Reserved for inherently not codable concepts without codable children: Secondary | ICD-10-CM

## 2014-06-27 LAB — CBC
HCT: 37.3 % (ref 36.0–46.0)
HEMOGLOBIN: 12.6 g/dL (ref 12.0–15.0)
MCH: 28.5 pg (ref 26.0–34.0)
MCHC: 33.8 g/dL (ref 30.0–36.0)
MCV: 84.4 fL (ref 78.0–100.0)
PLATELETS: 147 10*3/uL — AB (ref 150–400)
RBC: 4.42 MIL/uL (ref 3.87–5.11)
RDW: 13.6 % (ref 11.5–15.5)
WBC: 11.2 10*3/uL — ABNORMAL HIGH (ref 4.0–10.5)

## 2014-06-27 MED ORDER — OXYCODONE-ACETAMINOPHEN 5-325 MG PO TABS: 2.0000 | ORAL_TABLET | ORAL | Status: DC | PRN

## 2014-06-27 MED ORDER — TERBUTALINE SULFATE 1 MG/ML IJ SOLN
0.2500 mg | Freq: Once | INTRAMUSCULAR | Status: DC | PRN
  Filled 2014-06-27: qty 1

## 2014-06-27 MED ORDER — LACTATED RINGERS IV SOLN: INTRAVENOUS | Status: DC

## 2014-06-27 MED ORDER — LACTATED RINGERS IV SOLN
500.0000 mL | Freq: Once | INTRAVENOUS | Status: AC
  Administered 2014-06-27: 500 mL via INTRAVENOUS

## 2014-06-27 MED ORDER — SENNOSIDES-DOCUSATE SODIUM 8.6-50 MG PO TABS
2.0000 | ORAL_TABLET | ORAL | Status: DC
  Administered 2014-06-27: 2 via ORAL
  Filled 2014-06-27: qty 2

## 2014-06-27 MED ORDER — ONDANSETRON HCL 4 MG PO TABS: 4.0000 mg | ORAL_TABLET | ORAL | Status: DC | PRN

## 2014-06-27 MED ORDER — DIPHENHYDRAMINE HCL 50 MG/ML IJ SOLN: 12.5000 mg | INTRAMUSCULAR | Status: DC | PRN

## 2014-06-27 MED ORDER — LANOLIN HYDROUS EX OINT: TOPICAL_OINTMENT | CUTANEOUS | Status: DC | PRN

## 2014-06-27 MED ORDER — EPHEDRINE 5 MG/ML INJ
10.0000 mg | INTRAVENOUS | Status: DC | PRN
  Filled 2014-06-27: qty 2

## 2014-06-27 MED ORDER — OXYTOCIN 40 UNITS IN LACTATED RINGERS INFUSION - SIMPLE MED
62.5000 mL/h | INTRAVENOUS | Status: DC
  Administered 2014-06-27: 999 mL/h via INTRAVENOUS
  Filled 2014-06-27: qty 1000

## 2014-06-27 MED ORDER — OXYCODONE-ACETAMINOPHEN 5-325 MG PO TABS: 1.0000 | ORAL_TABLET | ORAL | Status: DC | PRN

## 2014-06-27 MED ORDER — SIMETHICONE 80 MG PO CHEW: 80.0000 mg | CHEWABLE_TABLET | ORAL | Status: DC | PRN

## 2014-06-27 MED ORDER — OXYTOCIN 40 UNITS IN LACTATED RINGERS INFUSION - SIMPLE MED
1.0000 m[IU]/min | INTRAVENOUS | Status: DC
  Administered 2014-06-27: 1 m[IU]/min via INTRAVENOUS

## 2014-06-27 MED ORDER — PRENATAL MULTIVITAMIN CH
1.0000 | ORAL_TABLET | Freq: Every day | ORAL | Status: DC
  Administered 2014-06-27: 1 via ORAL
  Filled 2014-06-27: qty 1

## 2014-06-27 MED ORDER — PHENYLEPHRINE 40 MCG/ML (10ML) SYRINGE FOR IV PUSH (FOR BLOOD PRESSURE SUPPORT)
80.0000 ug | PREFILLED_SYRINGE | INTRAVENOUS | Status: DC | PRN
  Filled 2014-06-27: qty 2

## 2014-06-27 MED ORDER — LACTATED RINGERS IV SOLN: 500.0000 mL | INTRAVENOUS | Status: DC | PRN

## 2014-06-27 MED ORDER — CITRIC ACID-SODIUM CITRATE 334-500 MG/5ML PO SOLN: 30.0000 mL | ORAL | Status: DC | PRN

## 2014-06-27 MED ORDER — LACTATED RINGERS IV SOLN
INTRAVENOUS | Status: DC
  Administered 2014-06-27: 06:00:00 via INTRAVENOUS

## 2014-06-27 MED ORDER — FENTANYL 2.5 MCG/ML BUPIVACAINE 1/10 % EPIDURAL INFUSION (WH - ANES): 14.0000 mL/h | INTRAMUSCULAR | Status: DC | PRN

## 2014-06-27 MED ORDER — DIPHENHYDRAMINE HCL 25 MG PO CAPS: 25.0000 mg | ORAL_CAPSULE | Freq: Four times a day (QID) | ORAL | Status: DC | PRN

## 2014-06-27 MED ORDER — FLEET ENEMA 7-19 GM/118ML RE ENEM: 1.0000 | ENEMA | RECTAL | Status: DC | PRN

## 2014-06-27 MED ORDER — OXYTOCIN BOLUS FROM INFUSION: 500.0000 mL | INTRAVENOUS | Status: DC

## 2014-06-27 MED ORDER — PHENYLEPHRINE 40 MCG/ML (10ML) SYRINGE FOR IV PUSH (FOR BLOOD PRESSURE SUPPORT)
PREFILLED_SYRINGE | INTRAVENOUS | Status: AC
  Filled 2014-06-27: qty 20

## 2014-06-27 MED ORDER — DIBUCAINE 1 % RE OINT: 1.0000 "application " | TOPICAL_OINTMENT | RECTAL | Status: DC | PRN

## 2014-06-27 MED ORDER — OXYCODONE-ACETAMINOPHEN 5-325 MG PO TABS
1.0000 | ORAL_TABLET | ORAL | Status: DC | PRN
  Administered 2014-06-27: 1 via ORAL
  Filled 2014-06-27: qty 1

## 2014-06-27 MED ORDER — WITCH HAZEL-GLYCERIN EX PADS: 1.0000 "application " | MEDICATED_PAD | CUTANEOUS | Status: DC | PRN

## 2014-06-27 MED ORDER — ONDANSETRON HCL 4 MG/2ML IJ SOLN: 4.0000 mg | Freq: Four times a day (QID) | INTRAMUSCULAR | Status: DC | PRN

## 2014-06-27 MED ORDER — BENZOCAINE-MENTHOL 20-0.5 % EX AERO
1.0000 "application " | INHALATION_SPRAY | CUTANEOUS | Status: DC | PRN
  Administered 2014-06-27: 1 via TOPICAL
  Filled 2014-06-27: qty 56

## 2014-06-27 MED ORDER — ACETAMINOPHEN 325 MG PO TABS: 650.0000 mg | ORAL_TABLET | ORAL | Status: DC | PRN

## 2014-06-27 MED ORDER — ZOLPIDEM TARTRATE 5 MG PO TABS: 5.0000 mg | ORAL_TABLET | Freq: Every evening | ORAL | Status: DC | PRN

## 2014-06-27 MED ORDER — FENTANYL 2.5 MCG/ML BUPIVACAINE 1/10 % EPIDURAL INFUSION (WH - ANES)
INTRAMUSCULAR | Status: AC
  Administered 2014-06-27: 14 mL/h via EPIDURAL
  Filled 2014-06-27: qty 125

## 2014-06-27 MED ORDER — PRENATAL MULTIVITAMIN CH: 1.0000 | ORAL_TABLET | Freq: Every day | ORAL | Status: DC

## 2014-06-27 MED ORDER — LIDOCAINE HCL (PF) 1 % IJ SOLN
INTRAMUSCULAR | Status: DC | PRN
  Administered 2014-06-27 (×2): 5 mL

## 2014-06-27 MED ORDER — LIDOCAINE HCL (PF) 1 % IJ SOLN
30.0000 mL | INTRAMUSCULAR | Status: DC | PRN
  Administered 2014-06-27: 30 mL via SUBCUTANEOUS
  Filled 2014-06-27: qty 30

## 2014-06-27 MED ORDER — ONDANSETRON HCL 4 MG/2ML IJ SOLN: 4.0000 mg | INTRAMUSCULAR | Status: DC | PRN

## 2014-06-27 MED ORDER — IBUPROFEN 600 MG PO TABS
600.0000 mg | ORAL_TABLET | Freq: Four times a day (QID) | ORAL | Status: DC
Start: 2014-06-27 — End: 2014-06-28
  Administered 2014-06-27 – 2014-06-28 (×5): 600 mg via ORAL
  Filled 2014-06-27 (×5): qty 1

## 2014-06-27 NOTE — Lactation Note (Signed)
This note was copied from the chart of [place]. Lactation Consultation Note Mom called out for feeding assessment, LC was not available.  Mom later reports feeding went well and mom is hearing swallows.    Patient Name: [name] [id number] Date: 06/27/2014 Reason for consult: Initial assessment;Other (Comment) (per mom recently breast fed and presently sleeping )   Maternal Data Does the patient have breastfeeding experience prior to this delivery?: Yes  Feeding Feeding Type: Breast Fed Length of feed: 10 min  LATCH Score/Interventions Latch: Grasps breast easily, tongue down, lips flanged, rhythmical sucking.  Audible Swallowing: A few with stimulation Intervention(s): Skin to skin;Hand expression  Type of Nipple: Everted at rest and after stimulation  Comfort (Breast/Nipple): Soft / non-tender     Hold (Positioning): No assistance needed to correctly position infant at breast.  LATCH Score: 9  Lactation Tools Discussed/Used WIC Program: No   Consult Status Consult Status: Follow-up (mom to page for feeding assessent ) Date: 06/28/14 Follow-up type: In-patient    Kendell Bane Justine Null 06/27/2014, 9:36 PM

## 2014-06-27 NOTE — Anesthesia Postprocedure Evaluation (Signed)
  Anesthesia Post-op Note Anesthesia Post Note  Patient: [name]  Procedure(s) Performed: * No procedures listed *  Anesthesia type: Epidural  Patient location: Mother/Baby  Post pain: Pain level controlled  Post assessment: Post-op Vital signs reviewed  Last Vitals:  Filed Vitals:   06/27/14 1450  BP: 116/69  Pulse: 72  Temp: 36.9 C  Resp: 18    Post vital signs: Reviewed  Level of consciousness:alert  Complications: No apparent anesthesia complications

## 2014-06-27 NOTE — Progress Notes (Signed)
Patient ID: [name], female   DOB: 1981/02/21, 34 y.o.   MRN: [id number]  Late entry  Pt comfortable with epidural.  AFVSS gen NAD AROM for thin meconium, d/w pt SVE 9/90/0-+1  Pt progressed rapidly to C/C/+1-2  Will push and plan for delivery.

## 2014-06-27 NOTE — Anesthesia Preprocedure Evaluation (Signed)
Anesthesia Evaluation  Patient identified by MRN, date of birth, ID band Patient awake    Reviewed: Allergy & Precautions, H&P , Patient's Chart, lab work & pertinent test results  Airway Mallampati: II  TM Distance: >3 FB Neck ROM: full    Dental   Pulmonary  breath sounds clear to auscultation        Cardiovascular Rhythm:regular Rate:Normal     Neuro/Psych    GI/Hepatic   Endo/Other    Renal/GU      Musculoskeletal   Abdominal   Peds  Hematology   Anesthesia Other Findings   Reproductive/Obstetrics (+) Pregnancy                             Anesthesia Physical Anesthesia Plan  ASA: II  Anesthesia Plan: Epidural   Post-op Pain Management:    Induction:   Airway Management Planned:   Additional Equipment:   Intra-op Plan:   Post-operative Plan:   Informed Consent: I have reviewed the patients History and Physical, chart, labs and discussed the procedure including the risks, benefits and alternatives for the proposed anesthesia with the patient or authorized representative who has indicated his/her understanding and acceptance.     Plan Discussed with:   Anesthesia Plan Comments:         Anesthesia Quick Evaluation

## 2014-06-27 NOTE — Anesthesia Procedure Notes (Signed)
Epidural Patient location during procedure: OB Start time: 06/27/2014 6:04 AM  Staffing Anesthesiologist: Rudean Curt Performed by: anesthesiologist   Preanesthetic Checklist Completed: patient identified, site marked, surgical consent, pre-op evaluation, timeout performed, IV checked, risks and benefits discussed and monitors and equipment checked  Epidural Patient position: sitting Prep: site prepped and draped and DuraPrep Patient monitoring: continuous pulse ox and blood pressure Approach: midline Location: L3-L4 Injection technique: LOR air  Needle:  Needle type: Tuohy  Needle gauge: 17 G Needle length: 9 cm and 9 Needle insertion depth: 5 cm cm Catheter type: closed end flexible Catheter size: 19 Gauge Catheter at skin depth: 10 cm Test dose: negative  Assessment Events: blood not aspirated, injection not painful, no injection resistance, negative IV test and no paresthesia  Additional Notes Patient identified.  Risk benefits discussed including failed block, incomplete pain control, headache, nerve damage, paralysis, blood pressure changes, nausea, vomiting, reactions to medication both toxic or allergic, and postpartum back pain.  Patient expressed understanding and wished to proceed.  All questions were answered.  Sterile technique used throughout procedure and epidural site dressed with sterile barrier dressing. No paresthesia or other complications noted.The patient did not experience any signs of intravascular injection such as tinnitus or metallic taste in mouth nor signs of intrathecal spread such as rapid motor block. Please see nursing notes for vital signs.

## 2014-06-27 NOTE — Progress Notes (Signed)
Patient ID: [name], female   DOB: 12-05-1980, 34 y.o.   MRN: [id number] After her epidural her contractions have spaced out to 4-6 minutes apart. The cervix is 8 cm 100% effaced and the vertex is at -1/-2 station. Will start low dose pitocin

## 2014-06-27 NOTE — Lactation Note (Signed)
This note was copied from the chart of [place]. Lactation Consultation Note  Patient Name: Boy Jennet Scroggin [id number] Date: 06/27/2014 Reason for consult: Initial assessment;Other (Comment) (per mom recently breast fed and presently sleeping )  Mom is an  Experienced breast feeder. And per mom the baby has been to the breast several times since birth and  Has voided x2 and stooled x5. Per mom when latching has to ease down chin due to recessed chin.  Lc recommended firm support when baby is at the breast to ease the mouth open wider .  Per reports hearing swallows. Mother informed of post-discharge support and given phone number to the lactation department, including services for phone  call assistance; out-patient appointments; and breastfeeding support group. List of other breastfeeding resources in the community  given in the handout. Encouraged mother to call for problems or concerns related to breastfeeding. Mom encouraged to call with feeding cues.      Maternal Data Does the patient have breastfeeding experience prior to this delivery?: Yes  Feeding Feeding Type:  (encouraged to page for feeding assessment ) Length of feed: 10 min (per mom )  LATCH Score/Interventions                      Lactation Tools Discussed/Used WIC Program: No   Consult Status Consult Status: Follow-up (mom to page for feeding assessent ) Date: 06/28/14 Follow-up type: In-patient    Myer Haff 06/27/2014, 6:26 PM

## 2014-06-27 NOTE — H&P (Signed)
[name], [name] NO.:  [id number]  MEDICAL RECORD NO.:  [id number]  LOCATION:  9176                          FACILITY:  [facility]  PHYSICIAN:  Lucille Passy. [name], M.D. DATE OF BIRTH:  Mar 09, 1981  DATE OF ADMISSION:  06/27/2014 DATE OF DISCHARGE:                             HISTORY & PHYSICAL   PRESENT ILLNESS:  This is a 34 year old white female, para 1-0-1-1, gravida 3, EDC June 23, 2014, admitted in active labor.  Blood group and type O positive, negative antibody.  RPR negative.  Urine culture negative.  Hepatitis B surface antigen negative.  HIV negative.  GC and Chlamydia negative.  Rubella immune.  First trimester screen negative. AFP negative.  One-hour Glucola 107.  Repeat HIV and RPR negative and group B strep negative.  The patient began her prenatal course early in pregnancy.  Her EDC was assigned as June 23, 2014, based on her last menstrual period.  Ultrasound at 18-week showed normal growth and anatomy.  She had basically an uncomplicated prenatal course.  Her initial weight was 185 pounds.  Her last weight in the office on June 26, 2014 was 203.6.  At 38 weeks and 5 days, she had a temperature elevation to 102.  She developed a frontal headache.  She denied any respiratory symptoms, GI or GU symptoms.  She had pain with movement of her neck.  She was sent to the hospital and went to [facility] Emergency Room and was felt not to have any signs of meningitis. The next day, she continued to have a fever of 101-103.  She was recommended hydration and rest.  Her fever broke, and she was scheduled for induction on June 28, 2014 if not labor but then she went into labor this morning, beginning contractions to 15-20 minutes apart at 2 a.m. and at 4 a.m.  They got less than 5 minutes apart and quite painful.  She came to maternity admission unit and was noted to be 7 cm dilated.  She was admitted, has requested an epidural, and is waiting for  that.  PAST MEDICAL HISTORY:  Reveals that she did have postpartum depression. Placed on Zoloft and weaned off.  SURGICAL HISTORY:  T and A at age 81.  ALLERGIES:  Amoxicillin, rash as a child.  No latex allergy.  No food allergies.  SOCIAL HISTORY:  She never smoked.  Does not drink.  Denies illicit drugs.  Has 4 year of college, [employer]. 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  Maternal grandmother with cancer of the colon, died at age 31.  OBSTETRIC HISTORY:  Reveals 1 vaginal delivery in October 2014 of 7 pounds 10 ounces female vaginally.  PHYSICAL EXAMINATION:  VITAL SIGNS:  On admission:  Temperature is 98.0, pulse is 90, respirations 18, blood pressure is 118/80. HEART:  Normal size and sounds.  No murmurs. LUNGS:  Clear to auscultation.  Fundal height at her last prenatal visit was 39 cm per the RN and __________ the cervix is 7 cm.  Vertex presenting.  ADMITTING IMPRESSION:  Intrauterine pregnancy at 40 weeks and  4 days, active labor.  The patient requested an epidural.  She is trying to get that now.  She will be watched for continued progression in labor.     Lucille Passy. [name], M.D.     TFH/MEDQ  D:  06/27/2014  T:  06/27/2014  Job:  [id number]

## 2014-06-28 ENCOUNTER — Inpatient Hospital Stay (HOSPITAL_COMMUNITY): Admission: RE | Admit: 2014-06-28 | Payer: 59 | Source: Ambulatory Visit

## 2014-06-28 LAB — CBC
HCT: 32.5 % — ABNORMAL LOW (ref 36.0–46.0)
HEMOGLOBIN: 10.9 g/dL — AB (ref 12.0–15.0)
MCH: 29 pg (ref 26.0–34.0)
MCHC: 33.5 g/dL (ref 30.0–36.0)
MCV: 86.4 fL (ref 78.0–100.0)
Platelets: 122 10*3/uL — ABNORMAL LOW (ref 150–400)
RBC: 3.76 MIL/uL — ABNORMAL LOW (ref 3.87–5.11)
RDW: 13.9 % (ref 11.5–15.5)
WBC: 10.7 10*3/uL — ABNORMAL HIGH (ref 4.0–10.5)

## 2014-06-28 LAB — RPR: RPR Ser Ql: NONREACTIVE

## 2014-06-28 MED ORDER — OXYCODONE-ACETAMINOPHEN 5-325 MG PO TABS: 1.0000 | ORAL_TABLET | Freq: Four times a day (QID) | ORAL | Status: DC | PRN

## 2014-06-28 MED ORDER — IBUPROFEN 800 MG PO TABS: 800.0000 mg | ORAL_TABLET | Freq: Three times a day (TID) | ORAL | Status: AC | PRN

## 2014-06-28 MED ORDER — PRENATAL MULTIVITAMIN CH: 1.0000 | ORAL_TABLET | Freq: Every day | ORAL | Status: DC

## 2014-06-28 NOTE — Lactation Note (Addendum)
This note was copied from the chart of [place]. Lactation Consultation Note  Patient Name: [name] [id number] Date: 06/28/2014 Reason for consult: Follow-up assessment;Other (Comment) (baby was circ and presently sleeping )  Per mom nipples alittle tender. Sore nipple and engorgement prevention and tx reviewed .  LC recommended prior to latch - breast massage , hand express, and pre-pump to prime the milk ducts. Also discussed positioning pillows for a deeper latch due to the recessed chin. Per mom the Baby cluster fed last night. Per mom has a hand pump and DEBP at home.  Mother informed of post-discharge support and given phone number to the lactation department, including services  for phone call assistance; out-patient appointments; and breastfeeding support group. List of other breastfeeding resources  in the community given in the handout. Encouraged mother to call for problems or concerns related to breastfeeding. Baby was circ this am and has been sleepy since per mom and LC also noted baby sleeping in dads arms.   2nd visit - mom latched the baby with good pillow support. LC observed the feeding and noted intermittent dimpling . Reason for consult: Follow-up assessment;Other (Comment) (recessed chin )  2nd visit to check a latch - mom was independent with latch. LC noted intermittent dimpling  In cheeks. Showed mom how to obtain a deeper latch. Multiply swallows noted , increased with breast compressions. And dimpling disappeared. LC made recommendations for making sure the baby consistently obtained the depth at the breast. Sore nipple and engorgement prevention and tx reviewed. Mother informed of post-discharge support and given phone number to the lactation department, including services for phone call assistance; out-patient appointments; and breastfeeding support group. List of other breastfeeding resources in the community given in the handout. Encouraged  mother to call for problems or concerns related to breastfeeding.   Maternal Data    Feeding Feeding Type:  (pe rmom the last feeding was around 730 )  LATCH Score/Interventions                      Lactation Tools Discussed/Used Tools: Comfort gels (per mom has a hand pump at home )   Consult Status Consult Status: Complete Date: 06/28/14    Myer Haff 06/28/2014, 11:28 AM

## 2014-06-28 NOTE — Progress Notes (Signed)
Post Partum Day 1 Subjective: no complaints, up ad lib, voiding, tolerating PO and nl lochia, pain controlled  Objective: Blood pressure 107/66, pulse 72, temperature 98.6 F (37 C), temperature source Oral, resp. rate 20, height 5\' 7"  (1.702 m), weight 92.352 kg (203 lb 9.6 oz), SpO2 99 %, unknown if currently breastfeeding.  Physical Exam:  General: alert and no distress Lochia: appropriate Uterine Fundus: firm   Recent Labs  06/27/14 0535 06/28/14 0605  HGB 12.6 10.9*  HCT 37.3 32.5*    Assessment/Plan: Plan for discharge tomorrow, Breastfeeding and Lactation consult   LOS: 1 day   Bovard-Stuckert, Tanda Morrissey 06/28/2014, 8:13 AM

## 2014-06-28 NOTE — Discharge Summary (Signed)
Obstetric Discharge Summary Reason for Admission: onset of labor Prenatal Procedures: none Intrapartum Procedures: spontaneous vaginal delivery Postpartum Procedures: none Complications-Operative and Postpartum: 2nd  degree perineal laceration HEMOGLOBIN  Date Value Ref Range Status  06/28/2014 10.9* 12.0 - 15.0 g/dL Final   HCT  Date Value Ref Range Status  06/28/2014 32.5* 36.0 - 46.0 % Final    Physical Exam:  General: alert and no distress Lochia: appropriate Uterine Fundus: firm  Discharge Diagnoses: Term Pregnancy-delivered  Discharge Information: Date: 06/28/2014 Activity: pelvic rest Diet: routine Medications: PNV, Ibuprofen and Percocet Condition: stable Instructions: refer to practice specific booklet Discharge to: home Follow-up Information    Follow up with Bovard-Stuckert, Dannya Pitkin, MD. Schedule an appointment as soon as possible for a visit in 6 weeks.   Specialty:  Obstetrics and Gynecology   Why:  for postpartum check   Contact information:   [address] [place] [address] [phone]       Newborn Data: Live born female  Birth Weight: 8 lb 6 oz (3800 g) APGAR: 9, 9  Home with mother.  Bovard-Stuckert, Kiahna Banghart 06/28/2014, 9:06 AM

## 2014-06-28 NOTE — Progress Notes (Signed)
Clinical Social Work Department BRIEF PSYCHOSOCIAL ASSESSMENT 06/28/2014  Patient:  [name], [name]     Account Number:  [id number]     Admit date:  06/27/2014  Clinical Social Worker:  Lucita Ferrara, CLINICAL SOCIAL WORKER  Date/Time:  06/28/2014 10:30 AM  Referred by:  RN  Date Referred:  06/27/2014 Referred for  Other - See comment   Other Referral:   History of Postpartum depression   Interview type:  Family  PSYCHOSOCIAL DATA Living Status:  FAMILY Primary support name:  Matt Primary support relationship to patient:  SPOUSE Degree of support available:   MOB endorsed strong family support.   CURRENT CONCERNS Current Concerns  None Noted   SOCIAL WORK ASSESSMENT / PLAN CSW met with the MOB due to history of postpartum depression.  MOB provided consent for the FOB and other visitor to be present.  MOB and family presented as receptive and easily engaged.  MOB displayed a full range in affect, presented in a pleasant mood, no acute mental health symptoms noted.  MOB and FOB acknowledged reason for consult (history of PPD), and MOB denied any questions or concerns as she transitions to the postpartum period for a second time.  She stated that she was prescribed Zoloft to treat symptoms at approximately 5 months postpartum, and shared that she was tearful and having a difficult time with breastfeeding (low milk supply).  She shared that she also started to ask the FOB to stay at home from work in order to help her more.  The MOB and FOB reflected upon these behaviors and shared that this is what led to her reaching out to her MD to discuss treatment for postpartum depression.  MOB discussed that she took the Zoloft for approximately 4 months, felt benefit from the medication, but then discontinued the medication (with an appropriate ween) since she learned that she was pregnant again.  MOB denied any mood symptoms during the pregnancy, and denied anxiety related to developing postpartum  depression now.  She stated that she intends to not re-start medications unless needed, and she shared intention to take it "one day at a time".  MOB and FOB were receptive to exploring which symptoms would be needed to be seen that would warrant additional intervention, and they presented as aware of signs/symptoms of PPD. MOB shared that she has already spoken to her MD, and they are all aware of need to closely monitor her mood.    No further questions, concerns, or needs.     Assessment/plan status:  No Further Intervention Required/No barriers to discharge  Information/referral to community resources:   No referrals needed at ths time.   PATIENT'S/FAMILY'S RESPONSE TO PLAN OF CARE: MOB and FOB expressed appreication for the visit. They denied additional questions or concerns related to postpartum depression.  MOB expressed intention to notify her MD if she notes symptoms of postpartum depression.

## 2014-07-25 HISTORY — PX: INTRAUTERINE DEVICE INSERTION: SHX323

## 2014-08-08 LAB — HM PAP SMEAR: HM PAP: NEGATIVE

## 2014-09-16 ENCOUNTER — Emergency Department (HOSPITAL_BASED_OUTPATIENT_CLINIC_OR_DEPARTMENT_OTHER): Payer: 59

## 2014-09-16 ENCOUNTER — Emergency Department (HOSPITAL_BASED_OUTPATIENT_CLINIC_OR_DEPARTMENT_OTHER)
Admission: EM | Admit: 2014-09-16 | Discharge: 2014-09-16 | Disposition: A | Discharge status: Home / Self Care | Payer: 59 | Attending: Emergency Medicine | Admitting: Emergency Medicine

## 2014-09-16 ENCOUNTER — Encounter (HOSPITAL_BASED_OUTPATIENT_CLINIC_OR_DEPARTMENT_OTHER): Payer: Self-pay

## 2014-09-16 DIAGNOSIS — J209 Acute bronchitis, unspecified: Secondary | ICD-10-CM | POA: Diagnosis not present

## 2014-09-16 DIAGNOSIS — R059 Cough, unspecified: Secondary | ICD-10-CM

## 2014-09-16 DIAGNOSIS — J40 Bronchitis, not specified as acute or chronic: Secondary | ICD-10-CM

## 2014-09-16 DIAGNOSIS — Z880 Allergy status to penicillin: Secondary | ICD-10-CM | POA: Diagnosis not present

## 2014-09-16 DIAGNOSIS — R509 Fever, unspecified: Secondary | ICD-10-CM | POA: Diagnosis present

## 2014-09-16 DIAGNOSIS — Z3202 Encounter for pregnancy test, result negative: Secondary | ICD-10-CM | POA: Insufficient documentation

## 2014-09-16 DIAGNOSIS — Z79899 Other long term (current) drug therapy: Secondary | ICD-10-CM | POA: Diagnosis not present

## 2014-09-16 DIAGNOSIS — R05 Cough: Secondary | ICD-10-CM

## 2014-09-16 LAB — CBC WITH DIFFERENTIAL/PLATELET
BASOS PCT: 0 % (ref 0–1)
Basophils Absolute: 0 10*3/uL (ref 0.0–0.1)
EOS ABS: 0.1 10*3/uL (ref 0.0–0.7)
EOS PCT: 1 % (ref 0–5)
HEMATOCRIT: 41.2 % (ref 36.0–46.0)
Hemoglobin: 13.8 g/dL (ref 12.0–15.0)
Lymphocytes Relative: 13 % (ref 12–46)
Lymphs Abs: 0.9 10*3/uL (ref 0.7–4.0)
MCH: 28.5 pg (ref 26.0–34.0)
MCHC: 33.5 g/dL (ref 30.0–36.0)
MCV: 85.1 fL (ref 78.0–100.0)
MONOS PCT: 7 % (ref 3–12)
Monocytes Absolute: 0.5 10*3/uL (ref 0.1–1.0)
Neutro Abs: 5.1 10*3/uL (ref 1.7–7.7)
Neutrophils Relative %: 79 % — ABNORMAL HIGH (ref 43–77)
Platelets: 127 10*3/uL — ABNORMAL LOW (ref 150–400)
RBC: 4.84 MIL/uL (ref 3.87–5.11)
RDW: 13.3 % (ref 11.5–15.5)
WBC: 6.5 10*3/uL (ref 4.0–10.5)

## 2014-09-16 LAB — URINE MICROSCOPIC-ADD ON

## 2014-09-16 LAB — URINALYSIS, ROUTINE W REFLEX MICROSCOPIC
Bilirubin Urine: NEGATIVE
Glucose, UA: NEGATIVE mg/dL
Hgb urine dipstick: NEGATIVE
Ketones, ur: NEGATIVE mg/dL
Nitrite: NEGATIVE
Protein, ur: NEGATIVE mg/dL
Specific Gravity, Urine: 1.023 (ref 1.005–1.030)
UROBILINOGEN UA: 0.2 mg/dL (ref 0.0–1.0)
pH: 5.5 (ref 5.0–8.0)

## 2014-09-16 LAB — BASIC METABOLIC PANEL
Anion gap: 9 (ref 5–15)
BUN: 13 mg/dL (ref 6–23)
CO2: 23 mmol/L (ref 19–32)
CREATININE: 0.77 mg/dL (ref 0.50–1.10)
Calcium: 8.7 mg/dL (ref 8.4–10.5)
Chloride: 104 mmol/L (ref 96–112)
GFR calc Af Amer: >90 mL/min (ref >90)
GFR calc non Af Amer: >90 mL/min (ref >90)
GLUCOSE: 93 mg/dL (ref 70–99)
POTASSIUM: 3.8 mmol/L (ref 3.5–5.1)
Sodium: 136 mmol/L (ref 135–145)

## 2014-09-16 LAB — PREGNANCY, URINE: Preg Test, Ur: NEGATIVE

## 2014-09-16 LAB — RAPID STREP SCREEN (MED CTR MEBANE ONLY): Streptococcus, Group A Screen (Direct): NEGATIVE

## 2014-09-16 MED ORDER — DOXYCYCLINE HYCLATE 100 MG PO CAPS: 100.0000 mg | ORAL_CAPSULE | Freq: Two times a day (BID) | ORAL | Status: DC

## 2014-09-16 NOTE — Discharge Instructions (Signed)

## 2014-09-16 NOTE — ED Notes (Signed)
Pt verbalizes understanding of d/c instructions.  RX given reviewed.  Pt knows to f/U if releif of symptoms are not noted in a few days

## 2014-09-16 NOTE — ED Provider Notes (Signed)
CSN: [id number]     Arrival date & time 09/16/14  0807 History   First MD Initiated Contact with Patient 09/16/14 0818     Chief Complaint  Patient presents with  . Fever      HPI Patient presents with a 24-hour history of fever and sore throat and productive cough.  No other complaints.  Denies nausea vomiting or diarrhea.  Patient has an 11 week and 59-month-old at home.  Patient denies abdominal pain neck pain back pain or headache. Past Medical History  Diagnosis Date  . Medical history non-contributory   . SVD (spontaneous vaginal delivery) 06/27/2014   Past Surgical History  Procedure Laterality Date  . Tonsillectomy    . Wisdom tooth extraction     History reviewed. No pertinent family history. History  Substance Use Topics  . Smoking status: Never Smoker   . Smokeless tobacco: Not on file  . Alcohol Use: No   OB History    Gravida Para Term Preterm AB TAB SAB Ectopic Multiple Living   3 2 2  0 1 0 1 0 0 2     Review of Systems  All other systems reviewed and are negative  Allergies  Amoxicillin  Home Medications   Prior to Admission medications   Medication Sig Start Date End Date Taking? Authorizing Provider  doxycycline (VIBRAMYCIN) 100 MG capsule Take 1 capsule (100 mg total) by mouth 2 (two) times daily. 09/16/14   Leonard Schwartz, MD  ibuprofen (ADVIL,MOTRIN) 800 MG tablet Take 1 tablet (800 mg total) by mouth every 8 (eight) hours as needed. 06/28/14   Janyth Contes, MD  oxyCODONE-acetaminophen (PERCOCET/ROXICET) 5-325 MG per tablet Take 1-2 tablets by mouth every 6 (six) hours as needed. 06/28/14   Janyth Contes, MD  Prenatal Vit-Fe Fumarate-FA (PRENATAL MULTIVITAMIN) TABS tablet Take 1 tablet by mouth daily at 12 noon. 06/28/14   Jody Bovard-Stuckert, MD   BP 123/81 mmHg  Pulse 120  Temp(Src) 102.9 F (39.4 C) (Oral)  Resp 20  Ht 5\' 6"  (1.676 m)  Wt 177 lb (80.287 kg)  BMI 28.58 kg/m2  SpO2 96%  LMP   Breastfeeding? Unknown Physical  Exam Physical Exam  Nursing note and vitals reviewed. Constitutional: She is oriented to person, place, and time. She appears well-developed and well-nourished. No distress.  HENT: No evidence of erythematous tonsils or exudate of pharyngitis. Head: Normocephalic and atraumatic.  Eyes: Pupils are equal, round, and reactive to light.  Neck: Normal range of motion.  No significant adenopathy  Cardiovascular: Normal rate and intact distal pulses.   Pulmonary/Chest: No respiratory distress.  no wheezes rales or rhonchi Abdominal: Normal appearance. She exhibits no distension.  No CVA tenderness percussion  Musculoskeletal: Normal range of motion.  Neurological: She is alert and oriented to person, place, and time. No cranial nerve deficit.  Skin: Skin is warm and dry. No rash noted.    ED Course  Procedures (including critical care time)  Labs Review Labs Reviewed  CBC WITH DIFFERENTIAL/PLATELET - Abnormal; Notable for the following:    Platelets 127 (*)    Neutrophils Relative % 79 (*)    All other components within normal limits  URINALYSIS, ROUTINE W REFLEX MICROSCOPIC - Abnormal; Notable for the following:    Leukocytes, UA SMALL (*)    All other components within normal limits  URINE MICROSCOPIC-ADD ON - Abnormal; Notable for the following:    Bacteria, UA MANY (*)    All other components within normal limits  RAPID STREP  SCREEN  CULTURE, GROUP A STREP  BASIC METABOLIC PANEL  PREGNANCY, URINE    Imaging Review Dg Chest 2 View  09/16/2014   CLINICAL DATA:  Fever and cough for 2 day  EXAM: CHEST  2 VIEW  COMPARISON:  None.  FINDINGS: The heart size and mediastinal contours are within normal limits. Both lungs are clear. The visualized skeletal structures are unremarkable.  IMPRESSION: No active cardiopulmonary disease.   Electronically Signed   By: Marybelle Killings M.D.   On: 09/16/2014 09:25     EKG Interpretation None      MDM   Final diagnoses:  Cough  Bronchitis         Leonard Schwartz, MD 09/16/14 [phone]

## 2014-09-16 NOTE — ED Notes (Signed)
Pt reports generalized aches and  cough yesterday am, No fever.  This am woke feeling worse, took temp at home 101.7.  Took  Tylenol 1000 mg  at 0800.  Sore throat c coughing.

## 2014-09-18 LAB — CULTURE, GROUP A STREP: Strep A Culture: NEGATIVE

## 2015-09-03 ENCOUNTER — Encounter: Payer: Self-pay | Admitting: Family Medicine

## 2015-09-03 ENCOUNTER — Ambulatory Visit (INDEPENDENT_AMBULATORY_CARE_PROVIDER_SITE_OTHER): Payer: 59 | Admitting: Family Medicine

## 2015-09-03 VITALS — BP 139/80 | HR 70 | Temp 98.5°F | Resp 16 | Ht 66.0 in | Wt 184.0 lb

## 2015-09-03 DIAGNOSIS — Z0000 Encounter for general adult medical examination without abnormal findings: Secondary | ICD-10-CM | POA: Diagnosis not present

## 2015-09-03 NOTE — Progress Notes (Signed)
Office Note 09/03/2015  CC:  Chief Complaint  Patient presents with  . Establish Care  . Annual Exam    Pt is not fasting.     HPI:  [name] is a 35 y.o. White female who is here to establish care and get annual health maintenance exam. Patient's most recent primary MD: her GYN Old records from her GYN, Dr. Willis Modena, were reviewed prior to or during today's visit.  Past Medical History  Diagnosis Date  . Postpartum depression   . Thrombocytopenia (HCC)     Mild; above 100K  . History of syncope     As a teen.  Got tilt table testing and ended up on beta blocker for autonomic dysfunction.  Not an issue in since age 62 yrs.    Past Surgical History  Procedure Laterality Date  . Tonsillectomy    . Wisdom tooth extraction      Family History  Problem Relation Age of Onset  . Hypertension Mother   . Cancer Maternal Aunt   . Diabetes Maternal Uncle   . Heart disease Maternal Uncle   . Colon cancer Maternal Grandmother   . Heart disease Maternal Grandfather   . Heart attack Maternal Grandfather   . Alcohol abuse Paternal Grandmother     Social History   Social History  . Marital Status: Married    Spouse Name: N/A  . Number of Children: N/A  . Years of Education: N/A   Occupational History  . Not on file.   Social History Main Topics  . Smoking status: Never Smoker   . Smokeless tobacco: Never Used  . Alcohol Use: Yes     Comment: Occasionally/Social  . Drug Use: No  . Sexual Activity: Not on file   Other Topics Concern  . Not on file   Social History Narrative   Married, 2 children (2014 and 2016).   Occupation: Engineer, drilling.   Alc: social.   No tob.   Exercise: likes to but "out of practice".   Diet: "could do better"    Outpatient Encounter Prescriptions as of 09/03/2015  Medication Sig  . ibuprofen (ADVIL,MOTRIN) 800 MG tablet Take 1 tablet (800 mg total) by mouth every 8 (eight) hours as needed.  . [DISCONTINUED]  doxycycline (VIBRAMYCIN) 100 MG capsule Take 1 capsule (100 mg total) by mouth 2 (two) times daily. (Patient not taking: Reported on 09/03/2015)  . [DISCONTINUED] oxyCODONE-acetaminophen (PERCOCET/ROXICET) 5-325 MG per tablet Take 1-2 tablets by mouth every 6 (six) hours as needed. (Patient not taking: Reported on 09/03/2015)  . [DISCONTINUED] Prenatal Vit-Fe Fumarate-FA (PRENATAL MULTIVITAMIN) TABS tablet Take 1 tablet by mouth daily at 12 noon. (Patient not taking: Reported on 09/03/2015)   No facility-administered encounter medications on file as of 09/03/2015.    Allergies  Allergen Reactions  . Amoxicillin Rash    ROS Review of Systems  Constitutional: Negative for fever, chills, appetite change and fatigue.  HENT: Negative for congestion, dental problem, ear pain and sore throat.   Eyes: Negative for discharge, redness and visual disturbance.  Respiratory: Negative for cough, chest tightness, shortness of breath and wheezing.   Cardiovascular: Negative for chest pain, palpitations and leg swelling.  Gastrointestinal: Negative for nausea, vomiting, abdominal pain, diarrhea and blood in stool.  Genitourinary: Negative for dysuria, urgency, frequency, hematuria, flank pain and difficulty urinating.  Musculoskeletal: Negative for myalgias, back pain, joint swelling, arthralgias and neck stiffness.  Skin: Negative for pallor and rash.  Neurological: Negative for dizziness, speech  difficulty, weakness and headaches.  Hematological: Negative for adenopathy. Does not bruise/bleed easily.  Psychiatric/Behavioral: Negative for confusion and sleep disturbance. The patient is not nervous/anxious.     PE; Blood pressure 139/80, pulse 70, temperature 98.5 F (36.9 C), temperature source Oral, resp. rate 16, height 5\' 6"  (1.676 m), weight 184 lb (83.462 kg), SpO2 96 %, unknown if currently breastfeeding. Body mass index is 29.71 kg/(m^2). Pt examined with Sharen Hones, CMA, as chaperone. Gen:  Alert, well appearing.  Patient is oriented to person, place, time, and situation. AFFECT: pleasant, lucid thought and speech. ENT: Ears: EACs clear, normal epithelium.  TMs with good light reflex and landmarks bilaterally.  Eyes: no injection, icteris, swelling, or exudate.  EOMI, PERRLA. Nose: no drainage or turbinate edema/swelling.  No injection or focal lesion.  Mouth: lips without lesion/swelling.  Oral mucosa pink and moist.  Dentition intact and without obvious caries or gingival swelling.  Oropharynx without erythema, exudate, or swelling.  Neck: supple/nontender.  No LAD, mass, or TM.  Carotid pulses 2+ bilaterally, without bruits. CV: RRR, no m/r/g.   LUNGS: CTA bilat, nonlabored resps, good aeration in all lung fields. ABD: soft, NT, ND, BS normal.  No hepatospenomegaly or mass.  No bruits. EXT: no clubbing, cyanosis, or edema.  Musculoskeletal: no joint swelling, erythema, warmth, or tenderness.  ROM of all joints intact. Skin - no sores or suspicious lesions or rashes or color changes   Pertinent labs:   Glucose 93 on 09/16/14  Lab Results  Component Value Date   WBC 6.5 09/16/2014   HGB 13.8 09/16/2014   HCT 41.2 09/16/2014   MCV 85.1 09/16/2014   PLT 127* 09/16/2014   Lab Results  Component Value Date   CREATININE 0.77 09/16/2014   BUN 13 09/16/2014   NA 136 09/16/2014   K 3.8 09/16/2014   CL 104 09/16/2014   CO2 23 09/16/2014   Lab Results  Component Value Date   ALT 14 06/14/2014   AST 22 06/14/2014   ALKPHOS 142* 06/14/2014   BILITOT 0.8 06/14/2014   ASSESSMENT AND PLAN:   New pt: old records reviewed.  Health maintenance exam: Reviewed age and gender appropriate health maintenance issues (prudent diet, regular exercise, health risks of tobacco and excessive alcohol, use of seatbelts, fire alarms in home, use of sunscreen).  Also reviewed age and gender appropriate health screening as well as vaccine recommendations. Pap/pelvic UTD via her GYN, Dr.  Willis Modena. She'll return when fasting to get lab draw for health panel labs. No vaccines due.  An After Visit Summary was printed and given to the patient.  Return for lab visit (fasting) at pt's convenience.  Next o/v 1-2 yrs for CPE..  Signed:  Crissie Sickles, MD           09/03/2015

## 2015-09-03 NOTE — Progress Notes (Signed)
Pre visit review using our clinic review tool, if applicable. No additional management support is needed unless otherwise documented below in the visit note. 

## 2015-09-04 ENCOUNTER — Other Ambulatory Visit: Payer: 59

## 2015-09-06 ENCOUNTER — Other Ambulatory Visit (INDEPENDENT_AMBULATORY_CARE_PROVIDER_SITE_OTHER): Payer: 59

## 2015-09-06 DIAGNOSIS — Z0000 Encounter for general adult medical examination without abnormal findings: Secondary | ICD-10-CM | POA: Diagnosis not present

## 2015-09-06 LAB — CBC WITH DIFFERENTIAL/PLATELET
BASOS ABS: 0 10*3/uL (ref 0.0–0.1)
BASOS PCT: 0.3 % (ref 0.0–3.0)
EOS ABS: 0.2 10*3/uL (ref 0.0–0.7)
Eosinophils Relative: 3.8 % (ref 0.0–5.0)
HEMATOCRIT: 41.3 % (ref 36.0–46.0)
Hemoglobin: 14 g/dL (ref 12.0–15.0)
LYMPHS ABS: 2 10*3/uL (ref 0.7–4.0)
Lymphocytes Relative: 30.8 % (ref 12.0–46.0)
MCHC: 33.8 g/dL (ref 30.0–36.0)
MCV: 85.5 fl (ref 78.0–100.0)
MONO ABS: 0.3 10*3/uL (ref 0.1–1.0)
Monocytes Relative: 4 % (ref 3.0–12.0)
NEUTROS ABS: 4 10*3/uL (ref 1.4–7.7)
Neutrophils Relative %: 61.1 % (ref 43.0–77.0)
Platelets: 163 10*3/uL (ref 150.0–400.0)
RBC: 4.83 Mil/uL (ref 3.87–5.11)
RDW: 13 % (ref 11.5–15.5)
WBC: 6.6 10*3/uL (ref 4.0–10.5)

## 2015-09-06 LAB — COMPREHENSIVE METABOLIC PANEL
ALT: 16 U/L (ref 0–35)
AST: 18 U/L (ref 0–37)
Albumin: 4.8 g/dL (ref 3.5–5.2)
Alkaline Phosphatase: 62 U/L (ref 39–117)
BUN: 14 mg/dL (ref 6–23)
CHLORIDE: 105 meq/L (ref 96–112)
CO2: 26 mEq/L (ref 19–32)
Calcium: 9.6 mg/dL (ref 8.4–10.5)
Creatinine, Ser: 0.74 mg/dL (ref 0.40–1.20)
GFR: 95.98 mL/min (ref >60.00)
GLUCOSE: 78 mg/dL (ref 70–99)
Potassium: 4.1 mEq/L (ref 3.5–5.1)
Sodium: 139 mEq/L (ref 135–145)
Total Bilirubin: 1.1 mg/dL (ref 0.2–1.2)
Total Protein: 7.3 g/dL (ref 6.0–8.3)

## 2015-09-06 LAB — LIPID PANEL
CHOL/HDL RATIO: 4
Cholesterol: 195 mg/dL (ref 0–200)
HDL: 50.9 mg/dL (ref >39.00)
LDL Cholesterol: 125 mg/dL — ABNORMAL HIGH (ref 0–99)
NONHDL: 144.17
Triglycerides: 98 mg/dL (ref 0.0–149.0)
VLDL: 19.6 mg/dL (ref 0.0–40.0)

## 2015-09-06 LAB — TSH: TSH: 2.27 u[IU]/mL (ref 0.35–4.50)

## 2015-11-12 DIAGNOSIS — H5213 Myopia, bilateral: Secondary | ICD-10-CM | POA: Diagnosis not present

## 2015-11-12 DIAGNOSIS — H52203 Unspecified astigmatism, bilateral: Secondary | ICD-10-CM | POA: Diagnosis not present

## 2016-10-23 ENCOUNTER — Ambulatory Visit (INDEPENDENT_AMBULATORY_CARE_PROVIDER_SITE_OTHER): Payer: 59 | Admitting: Family Medicine

## 2016-10-23 ENCOUNTER — Encounter: Payer: Self-pay | Admitting: Family Medicine

## 2016-10-23 VITALS — BP 102/68 | HR 69 | Temp 98.5°F | Resp 16 | Ht 66.0 in | Wt 164.8 lb

## 2016-10-23 DIAGNOSIS — J3489 Other specified disorders of nose and nasal sinuses: Secondary | ICD-10-CM | POA: Diagnosis not present

## 2016-10-23 NOTE — Progress Notes (Signed)
OFFICE VISIT  10/23/2016   CC:  Chief Complaint  Patient presents with  . Skin Lesion    left nostril   HPI:    Patient is a 36 y.o.  female who presents for "bump on nose". Noted small bump inside L nostril about 1 mo ago.  Has grown over the last mo. No pain or itching.  Never had this before.  Has not applied anything to it.   Past Medical History:  Diagnosis Date  . History of syncope    As a teen.  Got tilt table testing and ended up on beta blocker for autonomic dysfunction.  Not an issue in since age 32 yrs.  . Postpartum depression   . Thrombocytopenia ([place])    Mild; above 100K    Past Surgical History:  Procedure Laterality Date  . TONSILLECTOMY    . WISDOM TOOTH EXTRACTION      Outpatient Medications Prior to Visit  Medication Sig Dispense Refill  . ibuprofen (ADVIL,MOTRIN) 800 MG tablet Take 1 tablet (800 mg total) by mouth every 8 (eight) hours as needed. 40 tablet 2   No facility-administered medications prior to visit.     Allergies  Allergen Reactions  . Amoxicillin Rash    ROS As per HPI  PE: Blood pressure 102/68, pulse 69, temperature 98.5 F (36.9 C), temperature source Oral, resp. rate 16, height 5\' 6"  (1.676 m), weight 164 lb 12 oz (74.7 kg), SpO2 99 %, unknown if currently breastfeeding. Gen: Alert, well appearing.  Patient is oriented to person, place, time, and situation. AFFECT: pleasant, lucid thought and speech. Left nasal vestibule with a 3 mm verrucous lesion on nasal septum.  No erythema, swelling, or tenderness to palpation.    LABS:    Chemistry      Component Value Date/Time   NA 139 09/06/2015 0831   K 4.1 09/06/2015 0831   CL 105 09/06/2015 0831   CO2 26 09/06/2015 0831   BUN 14 09/06/2015 0831   CREATININE 0.74 09/06/2015 0831      Component Value Date/Time   CALCIUM 9.6 09/06/2015 0831   ALKPHOS 62 09/06/2015 0831   AST 18 09/06/2015 0831   ALT 16 09/06/2015 0831   BILITOT 1.1 09/06/2015 0831        IMPRESSION AND PLAN:  Left nasal vestibule lesion; suspect benign (verrucous growth). Will refer to ENT for further eval/mgmt. Pt understands plan and is in agreement with this.  An After Visit Summary was printed and given to the patient.  FOLLOW UP: Return as needed.  Signed:  Crissie Sickles, MD           10/23/2016

## 2017-01-01 DIAGNOSIS — D369 Benign neoplasm, unspecified site: Secondary | ICD-10-CM | POA: Insufficient documentation

## 2017-01-08 DIAGNOSIS — B079 Viral wart, unspecified: Secondary | ICD-10-CM | POA: Diagnosis not present

## 2017-01-08 DIAGNOSIS — D369 Benign neoplasm, unspecified site: Secondary | ICD-10-CM | POA: Diagnosis not present

## 2017-01-20 DIAGNOSIS — H52203 Unspecified astigmatism, bilateral: Secondary | ICD-10-CM | POA: Diagnosis not present

## 2017-01-20 DIAGNOSIS — H5213 Myopia, bilateral: Secondary | ICD-10-CM | POA: Diagnosis not present

## 2017-08-07 ENCOUNTER — Encounter: Payer: Self-pay | Admitting: Family Medicine

## 2017-08-07 ENCOUNTER — Ambulatory Visit: Payer: 59 | Admitting: Family Medicine

## 2017-08-07 DIAGNOSIS — M25361 Other instability, right knee: Secondary | ICD-10-CM | POA: Diagnosis not present

## 2017-08-07 NOTE — Progress Notes (Signed)
   HPI  CC: Right-sided knee pain Patient is here with complaints of right-sided knee pain over the past few years.  She states that pain is located along the lateral aspect of the right knee.  Does not radiate.  She states that she has had this discomfort over the past 2-3 years ever since she had an injury which she believes she may have subluxed her patella.  She did not seek medical treatment after this injury.  She states that she occasionally feels as though her knee is "wobbly".  She denies any recent swelling.  No mechanical symptoms.  No weakness, numbness, or paresthesias.  No new injuries.  Previous Interventions Tried: Occasional ibuprofen  Past Injuries: Suspected subluxation of the patella as described above Past Surgeries: Noncontributory Smoking: Non-smoker Family Hx: Noncontributory  ROS: Per HPI; in addition no fever, no rash, no additional weakness, no additional numbness, no additional paresthesias, and no additional falls/injury.   Objective: BP 100/64   Ht 5\' 6"  (1.676 m)   Wt 155 lb (70.3 kg)   BMI 25.02 kg/m  Gen: NAD, well groomed, a/o x3, normal affect.  CV: Well-perfused. Warm.  Resp: Non-labored.  Neuro: Sensation intact throughout. No gross coordination deficits.  Gait: Nonpathologic posture, unremarkable stride without signs of limp or balance issues. Knee, Right: TTP noted at the lateral joint line and inferior-lateral aspect of the patella. Inspection was negative for erythema, ecchymosis, and effusion. No obvious bony abnormalities or signs of osteophyte development. Palpation yielded no asymmetric warmth; No condyle tenderness; No patellar crepitus. Patellar and quadriceps tendons unremarkable, and no tenderness of the pes anserine bursa. No obvious Baker's cyst development. ROM normal in flexion (135 degrees) and extension (0 degrees). Normal hamstring and quadriceps strength. Neurovascularly intact bilaterally.  - Ligaments: (Solid and consistent  endpoints)   - ACL (present bilaterally)   - PCL (present bilaterally)   - LCL (present bilaterally) >> solid endpoint but loose bilaterally   - MCL (present bilaterally)  - Meniscus:   - Thessaly: NEG  - Patella:   - Patellar grind/compression: NEG   - Patellar glide: Without apprehension >> but hypermobile   Assessment and Plan:  Patellar instability of right knee Patient is here with signs and symptoms consistent with prior patellar subluxation and chronic patellar instability.  No new injury.  No new swelling or mechanical symptoms. -Discussed treatment options ranging from conservative to surgical.  Patient prefers conservative management at this time. -Home exercise program: Quadricep strengthening exercises -Patellar stabilization brace provided today -Follow-up 4 weeks   Elberta Leatherwood, MD,MS [place] Fellow 08/07/2017 9:33 AM

## 2017-08-07 NOTE — Assessment & Plan Note (Signed)
Patient is here with signs and symptoms consistent with prior patellar subluxation and chronic patellar instability.  No new injury.  No new swelling or mechanical symptoms. -Discussed treatment options ranging from conservative to surgical.  Patient prefers conservative management at this time. -Home exercise program: Quadricep strengthening exercises -Patellar stabilization brace provided today -Follow-up 4 weeks

## 2017-08-07 NOTE — Addendum Note (Signed)
Addended byDorcas Mcmurray L on: 08/07/2017 03:10 PM   Modules accepted: Level of Service

## 2017-08-07 NOTE — Progress Notes (Signed)
[facility]: Attending Note: I have reviewed the chart, discussed wit the Sports Medicine Fellow. I agree with assessment and treatment plan as detailed in the [place] note. Right knee more problematic than left.  Certain activities including softball cause pain with lateral stepping.  Sometimes feels unstable when she picks up her 35-year-old child.  We will try her on conservative therapy with quadricep strengthening exercises and patellar stabilization brace.  Follow-up 3-4 weeks.

## 2017-09-04 ENCOUNTER — Ambulatory Visit: Payer: 59 | Admitting: Family Medicine

## 2017-09-04 ENCOUNTER — Ambulatory Visit (INDEPENDENT_AMBULATORY_CARE_PROVIDER_SITE_OTHER): Payer: 59

## 2017-09-04 VITALS — BP 104/76 | Ht 66.0 in | Wt 155.0 lb

## 2017-09-04 DIAGNOSIS — M25361 Other instability, right knee: Secondary | ICD-10-CM

## 2017-09-04 DIAGNOSIS — M25561 Pain in right knee: Secondary | ICD-10-CM

## 2017-09-04 NOTE — Progress Notes (Signed)
[facility]: Attending Note: I have reviewed the chart, discussed wit the Sports Medicine Fellow. I agree with assessment and treatment plan as detailed in the [place] note. She has had no significant improvement.  I do have some concern to this point since she is not improving that there may be some underlying patellofemoral pathology.  If she had repeated subluxation, she may have damaged the patellar facet area.  We will try another 2-3 weeks of conservative therapy and if she is not improving, would consider further imaging.  Additionally, MRI would be the test of choice but could also consider CT scan although not sure it would give [place] all the information we wanted.  I discussed this briefly with her.  She will follow-up in 3-4 weeks.

## 2017-09-04 NOTE — Progress Notes (Signed)
Chief complaint: Follow-up of right lateral knee pain x 2 years  History of present illness: [name] is a 37 year old female who presents to the sports medicine office today for follow-up of right lateral knee pain.  This is been a chronic issue for her over the last 2-3 years.  She reports that she was walking about 2-3 years ago and had a subluxation event involving her right patella.  She reports that she did not seek medical treatment after that time. She did not have any further subluxation events.  She has had intermittent right knee pain since that time, reports mainly feeling along the lateral aspect of her right knee.  She describes the pain as a throbbing and aching pain.  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  She presented here for initial presentation and evaluation of this right knee pain about a month ago back on 08/07/17.  At that time, she did not have any swelling or mechanical symptoms.  Conservative management was approached, including quadriceps strengthening exercises, using a patellar stabilization brace, and monitoring for any new or different symptoms.  She has been using occasional Tylenol and ibuprofen.  She reports that symptoms are about the same today.  She reports aggravating factors include pushing off with her right foot as well as going down stairs.  She reports that she did play volleyball last night and fortunately did not have any pain while playing volleyball.  She does not report of noticing any difference with the patellar stabilization brace.  She does not report of any painful popping, locking, catching, or symptoms of giving way.  She does not report of any warmth, erythema, ecchymosis, or effusion.  Review of systems:  As stated above  Interval past medical history, surgical history, family history, and social history obtained and unchanged.  Her past medical history is otherwise unremarkable, has no  chronic medical conditions; surgical history notable for tonsillectomy and wisdom tooth extraction; she does not report of any current tobacco use; family history notable for hypertension, CAD, MI, type 2 diabetes, colon cancer, and alcohol abuse; allergies and medications are reviewed and are reflected in EMR.  Physical exam: Vital signs are reviewed and are documented in the chart Gen.: Alert, oriented, appears stated age, in no apparent distress HEENT: Moist oral mucosa Respiratory: Normal respirations, able to speak in full sentences Cardiac: Regular rate, distal pulses 2+ Integumentary: No rashes on visible skin:  Neurologic: Strength 5/5 with bilateral knee flexion, knee extension, hip flexion, and hip abduction, sensation 2+ in bilateral lower extremities Psych: Normal affect, mood is described as good Musculoskeletal: Inspection of right knee reveals no obvious deformity or muscle atrophy, no warmth, erythema, ecchymosis, or effusion, she is actually more tender to palpation today along the distal IT band on the right side, this is only to deep palpation, no tenderness over the quadriceps tendon, patellar tendon, medial joint line, or lateral joint line, no signs of ligamentous instability as Lachman, anterior drawer, Lever, valgus, and varus stress testing negative, McMurray negative, patellar apprehension and patellar compression test are both negative, she does have full knee range of motion going from 0 degrees to 140 degrees on both sides, do feel slight crepitation along the arc of range of motion in both knees; she does have leg length discrepancy with the left leg being about half a centimeter longer than the right leg  Assessment and plan: 1. Right lateral knee pain  with history of patellar subluxation 2 years ago 2. Concern for chronic patellar instability  Plan: Discussed with [name] today that her IT band seems to be more aggravated with her today.  Question whether she may have  a component of distal IT band syndrome.  Will give her home exercises to focus more on IT band strengthening.  In addition, discussed given her symptoms are about the same, there is a concern for chronic patellar instability and development of potential patellofemoral osteoarthritic changes.  Will go ahead and order for x-rays of her right knee today to include AP, lateral, sunrise, Lutricia Feil.  Discussed that if she does not have any interval improvement in symptoms with these exercises, next step would be consideration of MRI for further evaluation to ensure no patellofemoral pathology.  Will have her follow-up in 4 weeks or sooner as needed.   Mort Sawyers, M.D. [facility]

## 2017-09-07 DIAGNOSIS — Z1389 Encounter for screening for other disorder: Secondary | ICD-10-CM | POA: Diagnosis not present

## 2017-09-07 DIAGNOSIS — Z130 Encounter for screening for diseases of the blood and blood-forming organs and certain disorders involving the immune mechanism: Secondary | ICD-10-CM | POA: Diagnosis not present

## 2017-09-07 DIAGNOSIS — Z01419 Encounter for gynecological examination (general) (routine) without abnormal findings: Secondary | ICD-10-CM | POA: Diagnosis not present

## 2017-09-07 DIAGNOSIS — B373 Candidiasis of vulva and vagina: Secondary | ICD-10-CM | POA: Diagnosis not present

## 2017-09-07 DIAGNOSIS — Z113 Encounter for screening for infections with a predominantly sexual mode of transmission: Secondary | ICD-10-CM | POA: Diagnosis not present

## 2017-09-07 DIAGNOSIS — Z30431 Encounter for routine checking of intrauterine contraceptive device: Secondary | ICD-10-CM | POA: Diagnosis not present

## 2017-09-07 DIAGNOSIS — Z6824 Body mass index (BMI) 24.0-24.9, adult: Secondary | ICD-10-CM | POA: Diagnosis not present

## 2017-09-07 DIAGNOSIS — L292 Pruritus vulvae: Secondary | ICD-10-CM | POA: Diagnosis not present

## 2017-09-07 MED FILL — FLUCONAZOLE 150 MG TABS: 150 | 4 days supply | Qty: 2 | Fill #0

## 2017-09-08 ENCOUNTER — Encounter: Payer: Self-pay | Admitting: Family Medicine

## 2017-09-08 ENCOUNTER — Encounter: Payer: Self-pay | Admitting: *Deleted

## 2017-09-08 DIAGNOSIS — F329 Major depressive disorder, single episode, unspecified: Secondary | ICD-10-CM | POA: Insufficient documentation

## 2017-09-08 DIAGNOSIS — O99345 Other mental disorders complicating the puerperium: Secondary | ICD-10-CM

## 2017-09-08 DIAGNOSIS — F53 Postpartum depression: Secondary | ICD-10-CM | POA: Insufficient documentation

## 2017-09-08 DIAGNOSIS — F32A Depression, unspecified: Secondary | ICD-10-CM | POA: Insufficient documentation

## 2017-09-08 DIAGNOSIS — B3731 Acute candidiasis of vulva and vagina: Secondary | ICD-10-CM | POA: Insufficient documentation

## 2017-09-08 DIAGNOSIS — B373 Candidiasis of vulva and vagina: Secondary | ICD-10-CM | POA: Insufficient documentation

## 2017-09-24 ENCOUNTER — Encounter: Payer: Self-pay | Admitting: Family Medicine

## 2017-10-02 ENCOUNTER — Ambulatory Visit: Payer: 59 | Admitting: Family Medicine

## 2017-12-07 ENCOUNTER — Encounter: Payer: Self-pay | Admitting: Family Medicine

## 2018-01-26 DIAGNOSIS — H5213 Myopia, bilateral: Secondary | ICD-10-CM | POA: Diagnosis not present

## 2018-01-26 DIAGNOSIS — H52203 Unspecified astigmatism, bilateral: Secondary | ICD-10-CM | POA: Diagnosis not present

## 2018-02-04 ENCOUNTER — Telehealth: Payer: 59 | Admitting: Family

## 2018-02-04 DIAGNOSIS — J028 Acute pharyngitis due to other specified organisms: Secondary | ICD-10-CM

## 2018-02-04 DIAGNOSIS — B9689 Other specified bacterial agents as the cause of diseases classified elsewhere: Secondary | ICD-10-CM | POA: Diagnosis not present

## 2018-02-04 MED ORDER — AZITHROMYCIN 250 MG PO TABS: ORAL_TABLET | ORAL | 0 refills | Status: DC

## 2018-02-04 MED ORDER — PREDNISONE 5 MG PO TABS: 5.0000 mg | ORAL_TABLET | ORAL | 0 refills | Status: DC

## 2018-02-04 MED ORDER — BENZONATATE 100 MG PO CAPS: 100.0000 mg | ORAL_CAPSULE | Freq: Three times a day (TID) | ORAL | 0 refills | Status: DC | PRN

## 2018-02-04 MED FILL — predniSONE 5 MG (21) TBPK: 5 | 6 days supply | Qty: 21 | Fill #0

## 2018-02-04 MED FILL — AZITHROMYCIN 250 MG TABLET: 250 | 5 days supply | Qty: 6 | Fill #0

## 2018-02-04 MED FILL — BENZONATATE 100 MG CAP: 100 | 5 days supply | Qty: 30 | Fill #0

## 2018-02-04 NOTE — Progress Notes (Signed)

## 2018-08-01 IMAGING — DX DG KNEE COMPLETE 4+V*R*
4 series · 4 of 4 positions shown · non-contrast
Comparison: None.

CLINICAL DATA: Right lateral knee pain for several months.

EXAM:
RIGHT KNEE - COMPLETE 4+ VIEW

[knee ap]
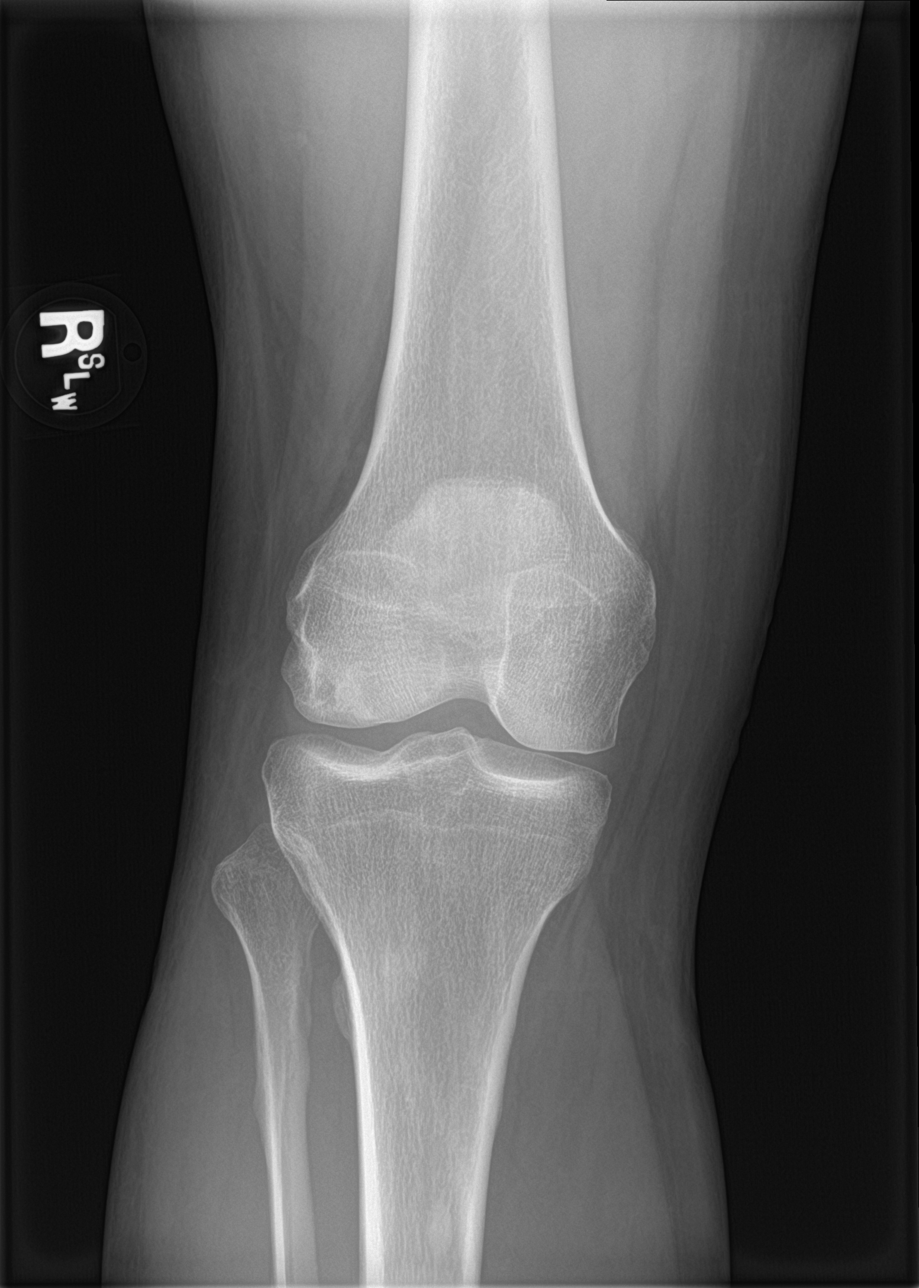

[knee lat]
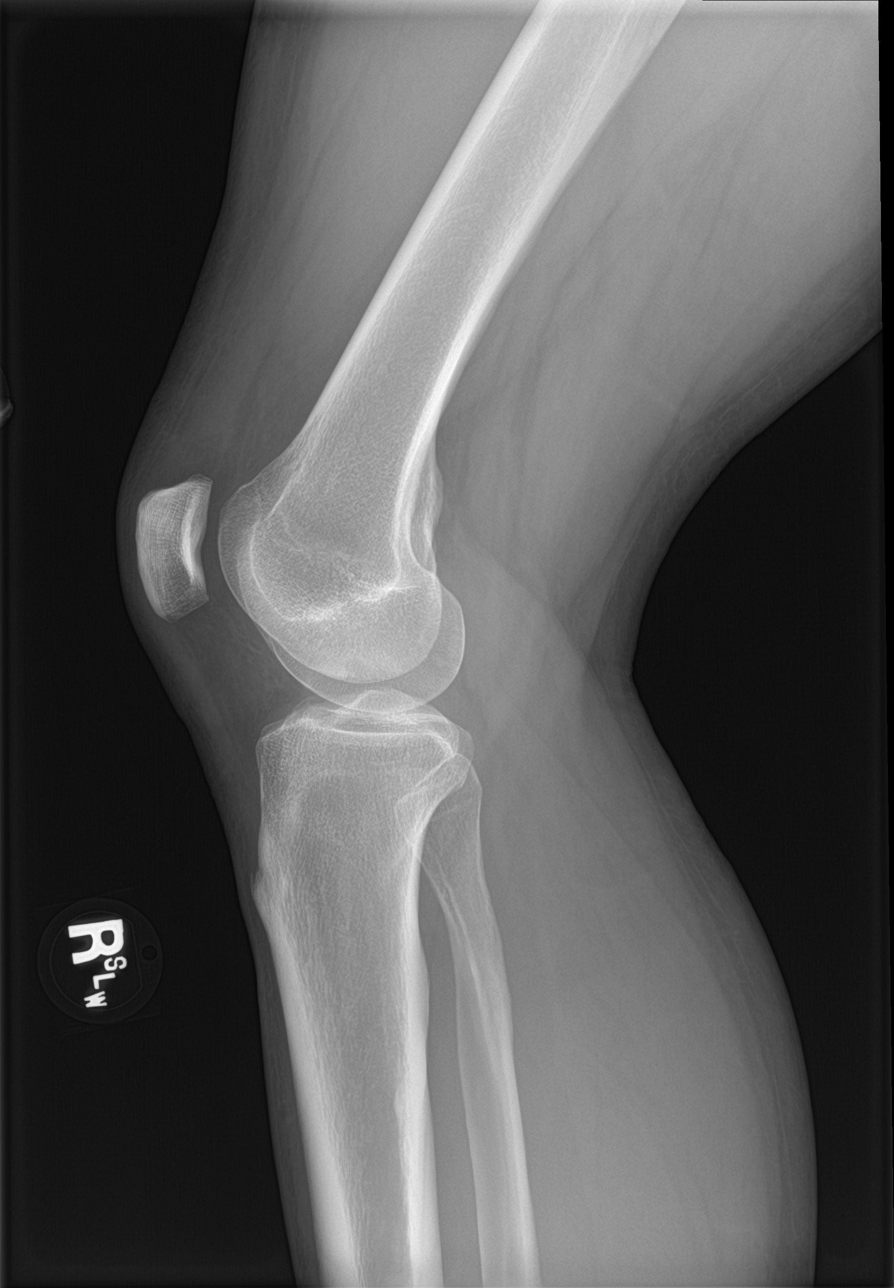

[knee obl (1 of 2)]
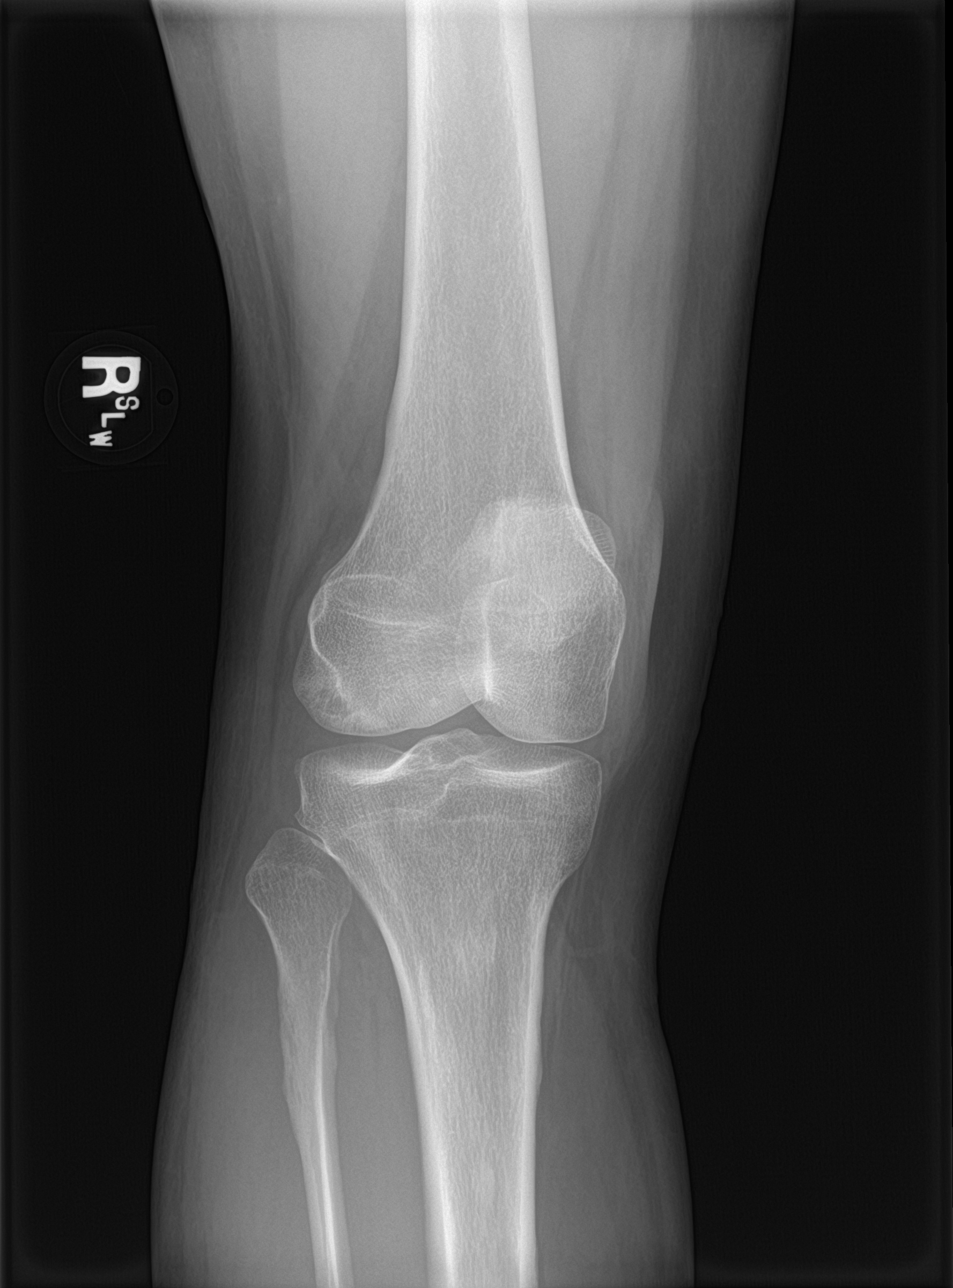

[knee obl (2 of 2)]
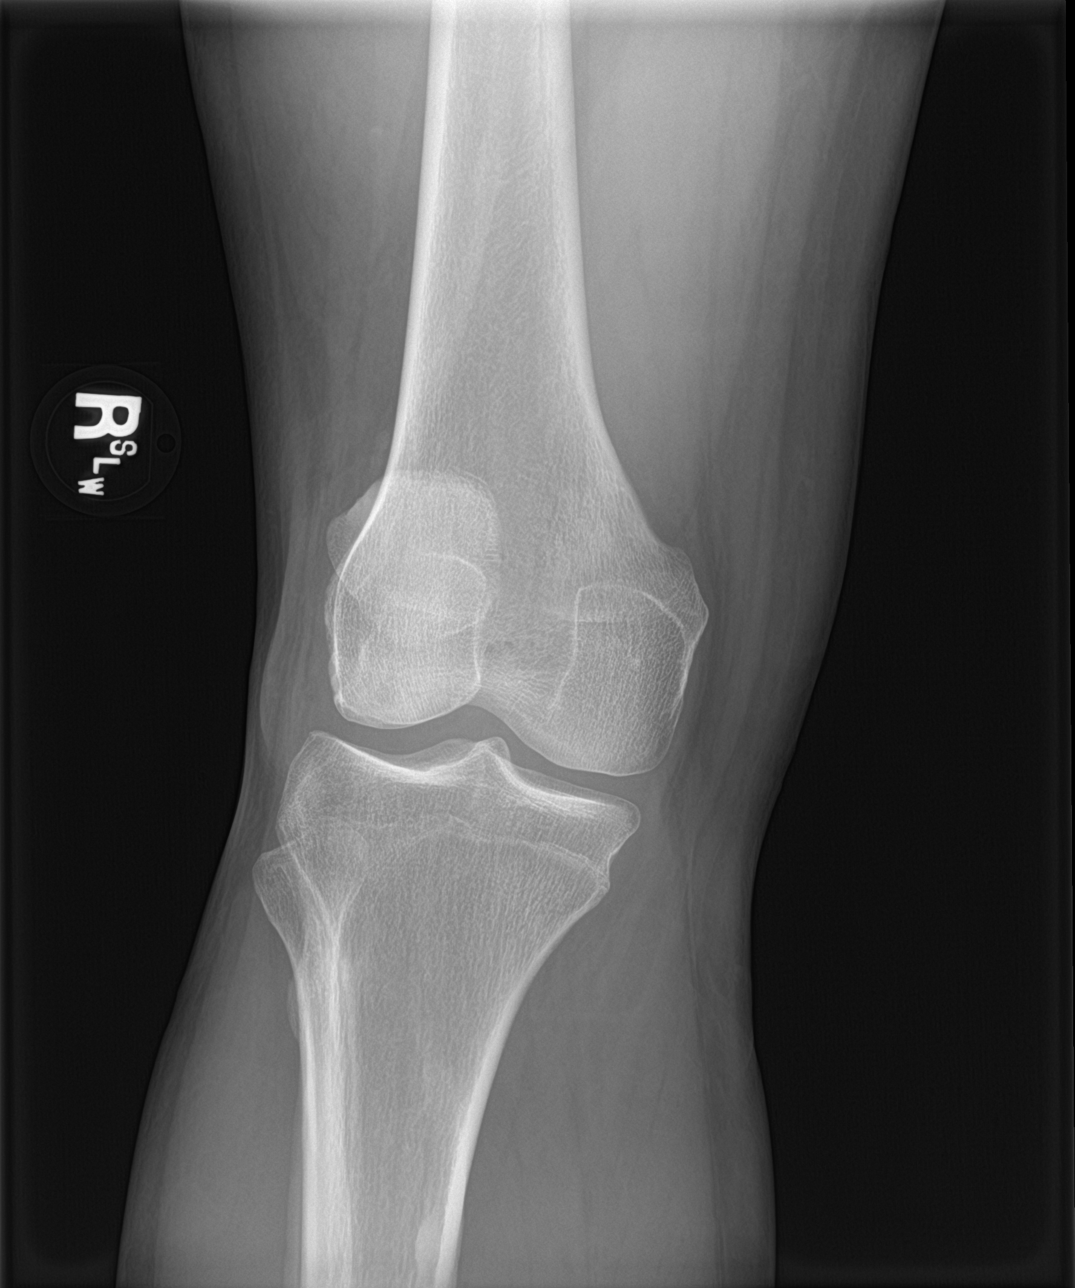

[4 of 4 positions shown; findings below may reference images not displayed]

FINDINGS: No fracture.  No bone lesion.

Knee joint is normally spaced and aligned. No arthropathic changes.
No joint effusion.

Soft tissues are unremarkable.
IMPRESSION: Negative.

## 2019-01-13 DIAGNOSIS — L292 Pruritus vulvae: Secondary | ICD-10-CM | POA: Diagnosis not present

## 2019-01-13 MED FILL — NYSTATIN-TRIAMCINOLONE CRM: 100000-0.1 | 10 days supply | Qty: 30 | Fill #0

## 2019-04-04 DIAGNOSIS — Z124 Encounter for screening for malignant neoplasm of cervix: Secondary | ICD-10-CM | POA: Diagnosis not present

## 2019-04-04 DIAGNOSIS — Z6827 Body mass index (BMI) 27.0-27.9, adult: Secondary | ICD-10-CM | POA: Diagnosis not present

## 2019-04-04 DIAGNOSIS — Z30431 Encounter for routine checking of intrauterine contraceptive device: Secondary | ICD-10-CM | POA: Diagnosis not present

## 2019-04-04 DIAGNOSIS — R61 Generalized hyperhidrosis: Secondary | ICD-10-CM | POA: Diagnosis not present

## 2019-04-04 DIAGNOSIS — Z1389 Encounter for screening for other disorder: Secondary | ICD-10-CM | POA: Diagnosis not present

## 2019-04-04 DIAGNOSIS — Z130 Encounter for screening for diseases of the blood and blood-forming organs and certain disorders involving the immune mechanism: Secondary | ICD-10-CM | POA: Diagnosis not present

## 2019-04-04 DIAGNOSIS — Z01419 Encounter for gynecological examination (general) (routine) without abnormal findings: Secondary | ICD-10-CM | POA: Diagnosis not present

## 2019-04-04 LAB — HM PAP SMEAR: HM Pap smear: NORMAL

## 2019-06-09 DIAGNOSIS — H5213 Myopia, bilateral: Secondary | ICD-10-CM | POA: Diagnosis not present

## 2019-06-09 DIAGNOSIS — H52203 Unspecified astigmatism, bilateral: Secondary | ICD-10-CM | POA: Diagnosis not present

## 2019-07-29 DIAGNOSIS — Z30011 Encounter for initial prescription of contraceptive pills: Secondary | ICD-10-CM | POA: Diagnosis not present

## 2019-07-29 DIAGNOSIS — Z30432 Encounter for removal of intrauterine contraceptive device: Secondary | ICD-10-CM | POA: Diagnosis not present

## 2019-07-29 MED FILL — ASHLYNA 0.15-0.03-0.01 MG T: 0.15-0.03 & | 91 days supply | Qty: 91 | Fill #0

## 2019-09-05 ENCOUNTER — Emergency Department (HOSPITAL_BASED_OUTPATIENT_CLINIC_OR_DEPARTMENT_OTHER)
Admission: EM | Admit: 2019-09-05 | Discharge: 2019-09-05 | Disposition: A | Discharge status: Home / Self Care | Payer: 59 | Attending: Emergency Medicine | Admitting: Emergency Medicine

## 2019-09-05 ENCOUNTER — Other Ambulatory Visit: Payer: Self-pay

## 2019-09-05 ENCOUNTER — Encounter (HOSPITAL_BASED_OUTPATIENT_CLINIC_OR_DEPARTMENT_OTHER): Payer: Self-pay

## 2019-09-05 DIAGNOSIS — S76911A Strain of unspecified muscles, fascia and tendons at thigh level, right thigh, initial encounter: Secondary | ICD-10-CM | POA: Insufficient documentation

## 2019-09-05 DIAGNOSIS — Z79899 Other long term (current) drug therapy: Secondary | ICD-10-CM | POA: Insufficient documentation

## 2019-09-05 DIAGNOSIS — Y999 Unspecified external cause status: Secondary | ICD-10-CM | POA: Diagnosis not present

## 2019-09-05 DIAGNOSIS — Y9302 Activity, running: Secondary | ICD-10-CM | POA: Insufficient documentation

## 2019-09-05 DIAGNOSIS — Y929 Unspecified place or not applicable: Secondary | ICD-10-CM | POA: Diagnosis not present

## 2019-09-05 DIAGNOSIS — X509XXA Other and unspecified overexertion or strenuous movements or postures, initial encounter: Secondary | ICD-10-CM | POA: Diagnosis not present

## 2019-09-05 DIAGNOSIS — S79921A Unspecified injury of right thigh, initial encounter: Secondary | ICD-10-CM | POA: Diagnosis present

## 2019-09-05 NOTE — ED Provider Notes (Signed)
[facility] Provider Note   CSN: [id number] Arrival date & time: 09/05/19  2040     History Chief Complaint  Patient presents with  . Leg Injury    [name] is a 39 y.o. female who presents with right thigh pain.  She states that she was running the other day and started to have some pain over the anterior thigh.  Pain was mild in nature.  Tonight she went to play kickball and when she ran for the ball she had a sudden onset of severe pain in the middle of her thigh that felt like tearing.  The pain radiated to her hip.  She is able to walk but states that it hurts to let her leg hanging down when sitting. She has increased pain with ROM of the knee and hip but is able to do it.  She reports associated swelling over the area.  She has not tried anything for symptoms.  HPI     Past Medical History:  Diagnosis Date  . History of syncope    As a teen.  Got tilt table testing and ended up on beta blocker for autonomic dysfunction.  Not an issue in since age 23 yrs.  . Patellar instability of right knee    2019, also IT band syndrome  . Postpartum depression   . 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 Candidal vulvovaginitis 09/08/2017  . Depressive disorder 09/08/2017  . Postpartum depression 09/08/2017  . Patellar instability of right knee 08/07/2017  . Squamous papilloma 01/01/2017  . Active labor 06/27/2014  . SVD (spontaneous vaginal delivery) 06/27/2014    Past Surgical History:  Procedure Laterality Date  . INTRAUTERINE DEVICE INSERTION  07/2014   [facility]  . TONSILLECTOMY    . WISDOM TOOTH EXTRACTION       OB History    Gravida  3   Para  2   Term  2   Preterm  0   AB  1   Living  2     SAB  1   TAB  0   Ectopic  0   Multiple  0   Live Births  2           Family History  Problem Relation Age of Onset  . Hypertension  Mother   . Cancer Maternal Aunt   . Diabetes Maternal Uncle   . Heart disease Maternal Uncle   . Colon cancer Maternal Grandmother   . Heart disease Maternal Grandfather   . Heart attack Maternal Grandfather   . Alcohol abuse Paternal Grandmother     Social History   Tobacco Use  . Smoking status: Never Smoker  . Smokeless tobacco: Never Used  Substance Use Topics  . Alcohol use: Yes    Comment: Occasionally/Social  . Drug use: No    Home Medications Prior to Admission medications   Medication Sig Start Date End Date Taking? Authorizing Provider  azithromycin (ZITHROMAX) 250 MG tablet Take 2 tabs now then 1 daily times 4 days. 02/04/18   Withrow, Elyse Jarvis, FNP  benzonatate (TESSALON PERLES) 100 MG capsule Take 1-2 capsules (100-200 mg total) by mouth every 8 (eight) hours as needed for cough. 02/04/18   Withrow, Elyse Jarvis, FNP  ibuprofen (ADVIL,MOTRIN) 800 MG tablet Take 1 tablet (800 mg total) by mouth every 8 (eight) hours as needed. 06/28/14  Bovard-Stuckert, Jody, MD  levonorgestrel (MIRENA, 52 MG,) 20 MCG/24HR IUD Mirena 20 mcg/24 hr (5 years) intrauterine device  Take 1 device by intrauterine route.    [provider]  predniSONE (DELTASONE) 5 MG tablet Take 1 tablet (5 mg total) by mouth as directed. Taper 6,5,4,3,2,1 02/04/18   Benjamine Mola, FNP    Allergies    Amoxicillin  Review of Systems   Review of Systems  Musculoskeletal: Positive for myalgias.       +swelling  Skin: Negative for wound.  Neurological: Negative for weakness.    Physical Exam Updated Vital Signs BP (!) 129/91 (BP Location: Left Arm)   Pulse 75   Temp 98.2 F (36.8 C) (Oral)   Resp 18   Ht 5\' 6"  (1.676 m)   Wt 74.4 kg   SpO2 100%   BMI 26.47 kg/m   Physical Exam Vitals and nursing note reviewed.  Constitutional:      General: She is not in acute distress.    Appearance: Normal appearance. She is well-developed. She is not ill-appearing.  HENT:     Head: Normocephalic and  atraumatic.  Eyes:     General: No scleral icterus.       Right eye: No discharge.        Left eye: No discharge.     Conjunctiva/sclera: Conjunctivae normal.     Pupils: Pupils are equal, round, and reactive to light.  Cardiovascular:     Rate and Rhythm: Normal rate.  Pulmonary:     Effort: Pulmonary effort is normal. No respiratory distress.  Abdominal:     General: There is no distension.  Musculoskeletal:     Cervical back: Normal range of motion.     Comments: Mild swelling over the anterior-medial aspect of the mid thigh. Pt is able to flex and extend the hip. She is able to flex and extend the knee and ambulate  Skin:    General: Skin is warm and dry.  Neurological:     Mental Status: She is alert and oriented to person, place, and time.  Psychiatric:        Behavior: Behavior normal.     ED Results / Procedures / Treatments   Labs (all labs ordered are listed, but only abnormal results are displayed) Labs Reviewed - No data to display  EKG None  Radiology No results found.  Procedures Procedures (including critical care time)  Medications Ordered in ED Medications - No data to display  ED Course  I have reviewed the triage vital signs and the nursing notes.  Pertinent labs & imaging results that were available during my care of the patient were reviewed by me and considered in my medical decision making (see chart for details).  39 year old female presents with a muscle strain of the quadriceps after playing kickball tonight.  Symptoms are consistent with mild to moderate strain.  Low suspicion for tendon rupture as she is able to range the hip and the knee.  X-rays not indicated at this time.  Advised rest, ice, compression, elevation and NSAIDs as needed for pain.  Advised return if worsening   MDM Rules/Calculators/A&P                      Final Clinical Impression(s) / ED Diagnoses Final diagnoses:  Muscle strain of right thigh, initial encounter     Rx / DC Orders ED Discharge Orders    None       Livio Ledwith,  Jesse Fall, PA-C 09/05/19 2311    Tegeler, Gwenyth Allegra, MD 09/05/19 [phone]

## 2019-09-05 NOTE — ED Triage Notes (Addendum)
Pt states she injured right anterior thigh "quad" 2 days ago and injured again today-NAD-steady gait

## 2019-09-05 NOTE — Discharge Instructions (Signed)
Please rest, ice, and apply and ACE wrap to have some compression of the leg Take Ibuprofen or Aleve as needed for pain Return if you are worsening

## 2019-10-28 MED FILL — ASHLYNA 0.15-0.03-0.01 MG T: 0.15-0.03 & | 91 days supply | Qty: 91 | Fill #1

## 2020-01-23 ENCOUNTER — Other Ambulatory Visit (HOSPITAL_COMMUNITY): Payer: Self-pay | Admitting: Obstetrics and Gynecology

## 2020-01-23 MED FILL — SERTRALINE HCL 50 MG TABLET: 50 | 33 days supply | Qty: 30 | Fill #0

## 2020-01-25 MED FILL — ASHLYNA 0.15-0.03-0.01 MG T: 0.15-0.03 & | 91 days supply | Qty: 91 | Fill #2

## 2020-03-24 MED FILL — SERTRALINE HCL 50 MG TABLET: 50 | 30 days supply | Qty: 30 | Fill #1

## 2020-04-27 MED FILL — ASHLYNA 0.15-0.03-0.01 MG T: 0.15-0.03 & | 91 days supply | Qty: 91 | Fill #3

## 2020-05-15 ENCOUNTER — Other Ambulatory Visit: Payer: Self-pay | Admitting: Physician Assistant

## 2020-05-15 ENCOUNTER — Telehealth: Payer: 59 | Admitting: Physician Assistant

## 2020-05-15 DIAGNOSIS — J019 Acute sinusitis, unspecified: Secondary | ICD-10-CM | POA: Diagnosis not present

## 2020-05-15 MED ORDER — DOXYCYCLINE HYCLATE 100 MG PO CAPS: 100.0000 mg | ORAL_CAPSULE | Freq: Two times a day (BID) | ORAL | 0 refills | Status: DC

## 2020-05-15 MED FILL — DOXYCYCLINE HYCLATE 100 MG: 100 | 7 days supply | Qty: 14 | Fill #0

## 2020-05-15 MED FILL — SERTRALINE HCL 50 MG TABLET: 50 | 30 days supply | Qty: 30 | Fill #2

## 2020-05-15 NOTE — Progress Notes (Signed)

## 2020-07-24 ENCOUNTER — Other Ambulatory Visit (HOSPITAL_COMMUNITY): Payer: Self-pay | Admitting: Obstetrics and Gynecology

## 2020-07-24 MED FILL — ASHLYNA 0.15-0.03-0.01 MG T: 0.15-0.03 & | 91 days supply | Qty: 91 | Fill #0

## 2020-07-26 ENCOUNTER — Other Ambulatory Visit (HOSPITAL_COMMUNITY): Payer: Self-pay | Admitting: Obstetrics and Gynecology

## 2020-07-26 DIAGNOSIS — Z6827 Body mass index (BMI) 27.0-27.9, adult: Secondary | ICD-10-CM | POA: Diagnosis not present

## 2020-07-26 DIAGNOSIS — Z01419 Encounter for gynecological examination (general) (routine) without abnormal findings: Secondary | ICD-10-CM | POA: Diagnosis not present

## 2020-07-26 DIAGNOSIS — Z130 Encounter for screening for diseases of the blood and blood-forming organs and certain disorders involving the immune mechanism: Secondary | ICD-10-CM | POA: Diagnosis not present

## 2020-07-26 DIAGNOSIS — Z3041 Encounter for surveillance of contraceptive pills: Secondary | ICD-10-CM | POA: Diagnosis not present

## 2020-07-26 DIAGNOSIS — Z1389 Encounter for screening for other disorder: Secondary | ICD-10-CM | POA: Diagnosis not present

## 2020-10-25 ENCOUNTER — Other Ambulatory Visit (HOSPITAL_COMMUNITY): Payer: Self-pay

## 2020-10-25 MED FILL — Levonorg-Eth Est Tab 0.15-0.03MG(84) & Eth Est Tab 0.01MG(7): ORAL | 91 days supply | Qty: 91 | Fill #0 | Status: AC

## 2020-10-27 ENCOUNTER — Other Ambulatory Visit (HOSPITAL_COMMUNITY): Payer: Self-pay

## 2021-01-23 ENCOUNTER — Other Ambulatory Visit: Payer: Self-pay

## 2021-01-23 ENCOUNTER — Other Ambulatory Visit (HOSPITAL_COMMUNITY): Payer: Self-pay

## 2021-01-25 ENCOUNTER — Other Ambulatory Visit (HOSPITAL_COMMUNITY): Payer: Self-pay

## 2021-01-25 MED FILL — Levonorg-Eth Est Tab 0.15-0.03MG(84) & Eth Est Tab 0.01MG(7): ORAL | 91 days supply | Qty: 91 | Fill #1 | Status: AC

## 2021-03-27 ENCOUNTER — Other Ambulatory Visit (HOSPITAL_COMMUNITY): Payer: Self-pay

## 2021-04-05 ENCOUNTER — Telehealth: Payer: 59 | Admitting: Physician Assistant

## 2021-04-05 DIAGNOSIS — B309 Viral conjunctivitis, unspecified: Secondary | ICD-10-CM | POA: Diagnosis not present

## 2021-04-05 NOTE — Progress Notes (Signed)
E-Visit for [employer]   We are sorry that you are not feeling well.  Here is how we plan to help!  Based on what you have shared with me it looks like you have conjunctivitis.  Conjunctivitis is a common inflammatory or infectious condition of the eye that is often referred to as "pink eye".  In most cases it is contagious (viral or bacterial). However, not all conjunctivitis requires antibiotics (ex. Allergic).  We have made appropriate suggestions for you based upon your presentation.  I recommend that you use OpconA, 1-2 drops every 4-6 hours (an over the counter allergy drop available at your local pharmacy).  Your pharmacist may have an alternative suggestion.  Pink eye can be highly contagious.  It is typically spread through direct contact with secretions, or contaminated objects or surfaces that one may have touched.  Strict handwashing is suggested with soap and water is urged.  If not available, use alcohol based had sanitizer.  Avoid unnecessary touching of the eye.  If you wear contact lenses, you will need to refrain from wearing them until you see no white discharge from the eye for at least 24 hours after being on medication.  You should see symptom improvement in 1-2 days after starting the medication regimen.  Call us if symptoms are not improved in 1-2 days.  Home Care: Wash your hands often! Do not wear your contacts until you complete your treatment plan. Avoid sharing towels, bed linen, personal items with a person who has pink eye. See attention for anyone in your home with similar symptoms.  Get Help Right Away If: Your symptoms do not improve. You develop blurred or loss of vision. Your symptoms worsen (increased discharge, pain or redness)   Thank you for choosing an e-visit.  Your e-visit answers were reviewed by a board certified advanced clinical practitioner to complete your personal care plan. Depending upon the condition, your plan could have included both over  the counter or prescription medications.  Please review your pharmacy choice. Make sure the pharmacy is open so you can pick up prescription now. If there is a problem, you may contact your provider through [employer] and have the prescription routed to another pharmacy.  Your safety is important to [place]. If you have drug allergies check your prescription carefully.   For the next 24 hours you can use MyChart to ask questions about today's visit, request a non-urgent call back, or ask for a work or school excuse. You will get an email in the next two days asking about your experience. I hope that your e-visit has been valuable and will speed your recovery.  I provided 5 minutes of non face-to-face time during this encounter for chart review and documentation.

## 2021-04-11 ENCOUNTER — Telehealth: Payer: Self-pay

## 2021-04-11 NOTE — Telephone Encounter (Signed)
Called pt to schedule CPE

## 2021-04-23 ENCOUNTER — Other Ambulatory Visit (HOSPITAL_COMMUNITY): Payer: Self-pay

## 2021-04-23 ENCOUNTER — Other Ambulatory Visit: Payer: Self-pay

## 2021-04-23 MED FILL — Levonorg-Eth Est Tab 0.15-0.03MG(84) & Eth Est Tab 0.01MG(7): ORAL | 91 days supply | Qty: 91 | Fill #2 | Status: AC

## 2021-04-24 ENCOUNTER — Other Ambulatory Visit (HOSPITAL_COMMUNITY): Payer: Self-pay

## 2021-04-24 MED ORDER — LEVONORGEST-ETH ESTRAD 91-DAY 0.15-0.03 &0.01 MG PO TABS
1.0000 | ORAL_TABLET | Freq: Every day | ORAL | 0 refills | Status: DC
  Filled 2021-04-24 – 2021-10-25 (×2): qty 91, 91d supply, fill #0

## 2021-07-16 ENCOUNTER — Other Ambulatory Visit (HOSPITAL_COMMUNITY): Payer: Self-pay

## 2021-07-16 MED FILL — Levonorg-Eth Est Tab 0.15-0.03MG(84) & Eth Est Tab 0.01MG(7): ORAL | 91 days supply | Qty: 91 | Fill #3 | Status: AC

## 2021-07-20 ENCOUNTER — Other Ambulatory Visit (HOSPITAL_COMMUNITY): Payer: Self-pay

## 2021-10-25 ENCOUNTER — Other Ambulatory Visit (HOSPITAL_COMMUNITY): Payer: Self-pay

## 2021-10-25 ENCOUNTER — Emergency Department (INDEPENDENT_AMBULATORY_CARE_PROVIDER_SITE_OTHER): Payer: 59

## 2021-10-25 ENCOUNTER — Encounter: Payer: Self-pay | Admitting: Emergency Medicine

## 2021-10-25 ENCOUNTER — Emergency Department
Admission: EM | Admit: 2021-10-25 | Discharge: 2021-10-25 | Disposition: A | Discharge status: Home / Self Care | Payer: 59 | Source: Home / Self Care

## 2021-10-25 DIAGNOSIS — S99911A Unspecified injury of right ankle, initial encounter: Secondary | ICD-10-CM | POA: Diagnosis not present

## 2021-10-25 DIAGNOSIS — M25571 Pain in right ankle and joints of right foot: Secondary | ICD-10-CM

## 2021-10-25 DIAGNOSIS — M25471 Effusion, right ankle: Secondary | ICD-10-CM | POA: Diagnosis not present

## 2021-10-25 DIAGNOSIS — Y9368 Activity, volleyball (beach) (court): Secondary | ICD-10-CM

## 2021-10-25 NOTE — ED Triage Notes (Signed)
Pt states she was playing volleyball last night and landed on her right ankle. She did hear a pop noise and has swelling and pain.

## 2021-10-25 NOTE — Discharge Instructions (Addendum)
Advised/informed patient of right ankle x-ray results.  Advised may use OTC Ibuprofen 600-800 mg 2-3 times daily as needed.  Advised/encouraged patient to RICE right ankle for 25 minutes 3 times daily for the next 3 days.  Advised patient if symptoms worsen and no unresolved please follow-up with [facility] orthopedic provider for further evaluation.  Contact information for this provider is included on this AVS.

## 2021-10-25 NOTE — ED Provider Notes (Signed)
[facility]    CSN: [id number] Arrival date & time: 10/25/21  5956      History   Chief Complaint Chief Complaint  Patient presents with   Ankle Pain    HPI [name] is a 41 y.o. female.   HPI 41 year old female presents with right ankle pain for 2 days reports playing volleyball last night and landed on her right ankle.  Reports hearing a popping noise immediate swelling and pain.  PMH significant for history of syncope and thrombocytopenia.  Past Medical History:  Diagnosis Date   History of syncope    As a teen.  Got tilt table testing and ended up on beta blocker for autonomic dysfunction.  Not an issue in since age 41 yrs.   Patellar instability of right knee    2019, also IT band syndrome   Postpartum depression    Thrombocytopenia ([facility])    Mild; above 100K    Patient Active Problem List   Diagnosis Date Noted   Candidal vulvovaginitis 09/08/2017   Depressive disorder 09/08/2017   Postpartum depression 09/08/2017   Patellar instability of right knee 08/07/2017   Squamous papilloma 01/01/2017   Active labor 06/27/2014   SVD (spontaneous vaginal delivery) 06/27/2014    Past Surgical History:  Procedure Laterality Date   INTRAUTERINE DEVICE INSERTION  07/2014   [facility]   TONSILLECTOMY     WISDOM TOOTH EXTRACTION      OB History     Gravida  3   Para  2   Term  2   Preterm  0   AB  1   Living  2      SAB  1   IAB  0   Ectopic  0   Multiple  0   Live Births  2            Home Medications    Prior to Admission medications   Medication Sig Start Date End Date Taking? Authorizing Provider  ibuprofen (ADVIL,MOTRIN) 800 MG tablet Take 1 tablet (800 mg total) by mouth every 8 (eight) hours as needed. 06/28/14   Bovard-Stuckert, Jeral Fruit, MD  Levonorgestrel-Ethinyl Estradiol (AMETHIA) 0.15-0.03 &0.01 MG tablet TAKE 1 TABLET BY MOUTH ONCE DAILY 07/26/20 10/24/21  Meisinger, Sherren Mocha, MD  Levonorgestrel-Ethinyl  Estradiol (ASHLYNA) 0.15-0.03 &0.01 MG tablet TAKE 1 TABLET BY MOUTH ONCE DAILY 04/24/21     sertraline (ZOLOFT) 50 MG tablet TAKE 1 TABLET BY MOUTH ONCE DAILY 01/23/20 01/22/21  Bovard-Stuckert, Jeral Fruit, MD    Family History Family History  Problem Relation Age of Onset   Hypertension Mother    Cancer Maternal Aunt    Diabetes Maternal Uncle    Heart disease Maternal Uncle    Colon cancer Maternal Grandmother    Heart disease Maternal Grandfather    Heart attack Maternal Grandfather    Alcohol abuse Paternal Grandmother     Social History Social History   Tobacco Use   Smoking status: Never   Smokeless tobacco: Never  Vaping Use   Vaping Use: Never used  Substance Use Topics   Alcohol use: Yes    Comment: Occasionally/Social   Drug use: No     Allergies   Amoxicillin   Review of Systems Review of Systems  Musculoskeletal:        Right ankle pain d/t injury last night    Physical Exam Triage Vital Signs ED Triage Vitals  Enc Vitals Group     BP 10/25/21 0834 119/83  Pulse Rate 10/25/21 0834 66     Resp 10/25/21 0834 16     Temp 10/25/21 0834 98 F (36.7 C)     Temp Source 10/25/21 0834 Oral     SpO2 10/25/21 0834 100 %     Weight --      Height --      Head Circumference --      Peak Flow --      Pain Score 10/25/21 0835 4     Pain Loc --      Pain Edu? --      Excl. in [place]? --    No data found.  Updated Vital Signs BP 119/83 (BP Location: Right Arm)   Pulse 66   Temp 98 F (36.7 C) (Oral)   Resp 16   LMP 10/24/2021 (Exact Date)   SpO2 100%       Physical Exam Vitals and nursing note reviewed.  Constitutional:      Appearance: Normal appearance. She is obese.  HENT:     Head: Normocephalic and atraumatic.     Mouth/Throat:     Mouth: Mucous membranes are moist.     Pharynx: Oropharynx is clear.  Eyes:     Extraocular Movements: Extraocular movements intact.     Conjunctiva/sclera: Conjunctivae normal.     Pupils: Pupils are equal,  round, and reactive to light.  Cardiovascular:     Rate and Rhythm: Normal rate and regular rhythm.     Pulses: Normal pulses.     Heart sounds: Normal heart sounds.  Pulmonary:     Effort: Pulmonary effort is normal.     Breath sounds: Normal breath sounds. No wheezing, rhonchi or rales.  Musculoskeletal:     Cervical back: Normal range of motion and neck supple.     Comments: Right Ankle: TTP over lateral malleolus, with moderate soft tissue swelling noted  Skin:    General: Skin is warm and dry.  Neurological:     General: No focal deficit present.     Mental Status: She is alert and oriented to person, place, and time. Mental status is at baseline.     UC Treatments / Results  Labs (all labs ordered are listed, but only abnormal results are displayed) Labs Reviewed - No data to display  EKG   Radiology DG Ankle Complete Right  Result Date: 10/25/2021 CLINICAL DATA:  Right ankle injury. Rolled ankle while playing volleyball last night. Lateral swelling. Pain. EXAM: RIGHT ANKLE - COMPLETE 3+ VIEW COMPARISON:  None Available. FINDINGS: Moderate lateral malleolar soft tissue swelling. Moderate-to-large plantar calcaneal heel spur. Joint spaces are preserved. The ankle mortise is symmetric and intact. No acute fracture is seen. No dislocation. IMPRESSION: 1. Moderate lateral malleolar soft tissue swelling. 2. No acute fracture is seen. Electronically Signed   By: Yvonne Kendall M.D.   On: 10/25/2021 09:11    Procedures Procedures (including critical care time)  Medications Ordered in UC Medications - No data to display  Initial Impression / Assessment and Plan / UC Course  I have reviewed the triage vital signs and the nursing notes.  Pertinent labs & imaging results that were available during my care of the patient were reviewed by me and considered in my medical decision making (see chart for details).     MDM: 1. Right Ankle Pain: Right ankle x-ray revealed above.  Advised/informed patient of right ankle x-ray results.  Advised may use OTC Ibuprofen 600-800 mg 2-3 times daily as needed.  Advised/encouraged patient to RICE right ankle for 25 minutes 3 times daily for the next 3 days.  Advised patient if symptoms worsen and no unresolved please follow-up with [facility] orthopedic provider for further evaluation.  Contact information for this provider is included on this AVS. patient discharged home, hemodynamically stable. Final Clinical Impressions(s) / UC Diagnoses   Final diagnoses:  Acute right ankle pain     Discharge Instructions      Advised/informed patient of right ankle x-ray results.  Advised may use OTC Ibuprofen 600-800 mg 2-3 times daily as needed.  Advised/encouraged patient to RICE right ankle for 25 minutes 3 times daily for the next 3 days.  Advised patient if symptoms worsen and no unresolved please follow-up with [facility] orthopedic provider for further evaluation.  Contact information for this provider is included on this AVS.     ED Prescriptions   None    PDMP not reviewed this encounter.   Eliezer Lofts, [place] 10/25/21 [phone]

## 2022-01-20 ENCOUNTER — Other Ambulatory Visit (HOSPITAL_COMMUNITY): Payer: Self-pay

## 2022-01-24 ENCOUNTER — Other Ambulatory Visit (HOSPITAL_COMMUNITY): Payer: Self-pay

## 2022-01-30 ENCOUNTER — Other Ambulatory Visit (HOSPITAL_COMMUNITY): Payer: Self-pay

## 2022-02-05 ENCOUNTER — Other Ambulatory Visit (HOSPITAL_COMMUNITY): Payer: Self-pay

## 2022-02-05 MED ORDER — LEVONORGEST-ETH ESTRAD 91-DAY 0.15-0.03 &0.01 MG PO TABS
1.0000 | ORAL_TABLET | Freq: Every day | ORAL | 0 refills | Status: DC
  Filled 2022-02-05: qty 91, 91d supply, fill #0

## 2022-02-12 ENCOUNTER — Other Ambulatory Visit (HOSPITAL_COMMUNITY): Payer: Self-pay

## 2022-03-07 ENCOUNTER — Other Ambulatory Visit (HOSPITAL_COMMUNITY): Payer: Self-pay

## 2022-03-07 DIAGNOSIS — Z1389 Encounter for screening for other disorder: Secondary | ICD-10-CM | POA: Diagnosis not present

## 2022-03-07 DIAGNOSIS — Z130 Encounter for screening for diseases of the blood and blood-forming organs and certain disorders involving the immune mechanism: Secondary | ICD-10-CM | POA: Diagnosis not present

## 2022-03-07 DIAGNOSIS — Z6828 Body mass index (BMI) 28.0-28.9, adult: Secondary | ICD-10-CM | POA: Diagnosis not present

## 2022-03-07 DIAGNOSIS — Z3041 Encounter for surveillance of contraceptive pills: Secondary | ICD-10-CM | POA: Diagnosis not present

## 2022-03-07 DIAGNOSIS — Z01419 Encounter for gynecological examination (general) (routine) without abnormal findings: Secondary | ICD-10-CM | POA: Diagnosis not present

## 2022-03-07 MED ORDER — LEVONORGEST-ETH ESTRAD 91-DAY 0.15-0.03 &0.01 MG PO TABS
1.0000 | ORAL_TABLET | Freq: Every day | ORAL | 4 refills | Status: AC
  Filled 2022-03-07 – 2022-05-01 (×2): qty 91, 91d supply, fill #0
  Filled 2022-07-21: qty 91, 91d supply, fill #1
  Filled 2022-10-06 (×2): qty 91, 91d supply, fill #2

## 2022-05-02 ENCOUNTER — Other Ambulatory Visit (HOSPITAL_COMMUNITY): Payer: Self-pay

## 2022-05-03 ENCOUNTER — Other Ambulatory Visit (HOSPITAL_COMMUNITY): Payer: Self-pay

## 2022-07-21 ENCOUNTER — Other Ambulatory Visit (HOSPITAL_COMMUNITY): Payer: Self-pay

## 2022-07-23 ENCOUNTER — Other Ambulatory Visit (HOSPITAL_COMMUNITY): Payer: Self-pay

## 2022-10-06 ENCOUNTER — Other Ambulatory Visit (HOSPITAL_COMMUNITY): Payer: Self-pay

## 2022-10-06 ENCOUNTER — Telehealth: Payer: Commercial Managed Care - PPO | Admitting: Physician Assistant

## 2022-10-06 DIAGNOSIS — H109 Unspecified conjunctivitis: Secondary | ICD-10-CM

## 2022-10-06 MED ORDER — POLYMYXIN B-TRIMETHOPRIM 10000-0.1 UNIT/ML-% OP SOLN
1.0000 [drp] | OPHTHALMIC | 0 refills | Status: AC
  Filled 2022-10-06: qty 10, 34d supply, fill #0

## 2022-10-06 NOTE — Progress Notes (Signed)

## 2023-03-17 ENCOUNTER — Other Ambulatory Visit (HOSPITAL_COMMUNITY): Payer: Self-pay

## 2023-03-17 MED ORDER — LEVONORGEST-ETH ESTRAD 91-DAY 0.15-0.03 &0.01 MG PO TABS
1.0000 | ORAL_TABLET | Freq: Every day | ORAL | 4 refills | Status: AC
  Filled 2023-03-17 – 2024-01-30 (×5): qty 91, 91d supply, fill #0

## 2023-03-27 ENCOUNTER — Other Ambulatory Visit: Payer: Self-pay | Admitting: Obstetrics and Gynecology

## 2023-03-27 DIAGNOSIS — R928 Other abnormal and inconclusive findings on diagnostic imaging of breast: Secondary | ICD-10-CM

## 2023-04-07 ENCOUNTER — Ambulatory Visit
Admission: RE | Admit: 2023-04-07 | Discharge: 2023-04-07 | Disposition: A | Discharge status: Home / Self Care | Payer: Commercial Managed Care - PPO | Source: Ambulatory Visit | Attending: Obstetrics and Gynecology | Admitting: Obstetrics and Gynecology

## 2023-04-07 ENCOUNTER — Ambulatory Visit
Admission: RE | Admit: 2023-04-07 | Discharge: 2023-04-07 | Disposition: A | Discharge status: Home / Self Care | Payer: Managed Care, Other (non HMO) | Source: Ambulatory Visit | Attending: Obstetrics and Gynecology | Admitting: Obstetrics and Gynecology

## 2023-04-07 ENCOUNTER — Other Ambulatory Visit: Payer: Self-pay | Admitting: Obstetrics and Gynecology

## 2023-04-07 DIAGNOSIS — N6310 Unspecified lump in the right breast, unspecified quadrant: Secondary | ICD-10-CM

## 2023-04-07 DIAGNOSIS — R928 Other abnormal and inconclusive findings on diagnostic imaging of breast: Secondary | ICD-10-CM

## 2023-04-14 ENCOUNTER — Other Ambulatory Visit: Payer: Self-pay | Admitting: Medical Genetics

## 2023-04-14 DIAGNOSIS — Z006 Encounter for examination for normal comparison and control in clinical research program: Secondary | ICD-10-CM

## 2023-05-15 ENCOUNTER — Other Ambulatory Visit (HOSPITAL_COMMUNITY): Payer: Self-pay | Attending: Medical Genetics

## 2023-06-22 ENCOUNTER — Other Ambulatory Visit (HOSPITAL_COMMUNITY): Payer: Self-pay

## 2023-06-26 ENCOUNTER — Other Ambulatory Visit (HOSPITAL_COMMUNITY)
Admission: RE | Admit: 2023-06-26 | Discharge: 2023-06-26 | Disposition: A | Discharge status: Home / Self Care | Payer: Self-pay | Source: Ambulatory Visit | Attending: Oncology | Admitting: Oncology

## 2023-06-26 DIAGNOSIS — Z006 Encounter for examination for normal comparison and control in clinical research program: Secondary | ICD-10-CM | POA: Insufficient documentation

## 2023-07-09 LAB — GENECONNECT MOLECULAR SCREEN: Genetic Analysis Overall Interpretation: NEGATIVE

## 2023-10-06 ENCOUNTER — Other Ambulatory Visit: Payer: Self-pay | Admitting: Obstetrics and Gynecology

## 2023-10-06 ENCOUNTER — Ambulatory Visit
Admission: RE | Admit: 2023-10-06 | Discharge: 2023-10-06 | Disposition: A | Discharge status: Home / Self Care | Payer: Managed Care, Other (non HMO) | Source: Ambulatory Visit | Attending: Obstetrics and Gynecology | Admitting: Obstetrics and Gynecology

## 2023-10-06 DIAGNOSIS — N6310 Unspecified lump in the right breast, unspecified quadrant: Secondary | ICD-10-CM

## 2023-10-24 ENCOUNTER — Other Ambulatory Visit (HOSPITAL_COMMUNITY): Payer: Self-pay

## 2023-10-26 ENCOUNTER — Other Ambulatory Visit (HOSPITAL_COMMUNITY): Payer: Self-pay

## 2024-01-30 ENCOUNTER — Other Ambulatory Visit (HOSPITAL_COMMUNITY): Payer: Self-pay

## 2024-06-22 ENCOUNTER — Other Ambulatory Visit (HOSPITAL_COMMUNITY): Payer: Self-pay

## 2024-06-22 MED ORDER — LEVONORGEST-ETH ESTRAD 91-DAY 0.15-0.03 &0.01 MG PO TABS
1.0000 | ORAL_TABLET | Freq: Every day | ORAL | 4 refills | Status: AC
  Filled 2024-06-22: qty 91, 91d supply, fill #0
